# Patient Record
Sex: Female | Born: 1945
Health system: Southern US, Community
[De-identification: ages and names within clinical notes are randomized; demographics above are authoritative.]

## PROBLEM LIST (undated history)

## (undated) DIAGNOSIS — K449 Diaphragmatic hernia without obstruction or gangrene: Secondary | ICD-10-CM

## (undated) DIAGNOSIS — E785 Hyperlipidemia, unspecified: Secondary | ICD-10-CM

## (undated) DIAGNOSIS — B2790 Infectious mononucleosis, unspecified without complication: Secondary | ICD-10-CM

## (undated) DIAGNOSIS — C801 Malignant (primary) neoplasm, unspecified: Secondary | ICD-10-CM

## (undated) DIAGNOSIS — K5790 Diverticulosis of intestine, part unspecified, without perforation or abscess without bleeding: Secondary | ICD-10-CM

## (undated) DIAGNOSIS — I341 Nonrheumatic mitral (valve) prolapse: Secondary | ICD-10-CM

## (undated) DIAGNOSIS — M8580 Other specified disorders of bone density and structure, unspecified site: Secondary | ICD-10-CM

## (undated) DIAGNOSIS — N6019 Diffuse cystic mastopathy of unspecified breast: Secondary | ICD-10-CM

## (undated) DIAGNOSIS — Z8619 Personal history of other infectious and parasitic diseases: Secondary | ICD-10-CM

## (undated) DIAGNOSIS — M1990 Unspecified osteoarthritis, unspecified site: Secondary | ICD-10-CM

## (undated) DIAGNOSIS — I10 Essential (primary) hypertension: Secondary | ICD-10-CM

## (undated) DIAGNOSIS — K219 Gastro-esophageal reflux disease without esophagitis: Secondary | ICD-10-CM

## (undated) HISTORY — DX: Hyperlipidemia, unspecified: E78.5

## (undated) HISTORY — DX: Other specified disorders of bone density and structure, unspecified site: M85.80

## (undated) HISTORY — PX: REDUCTION MAMMAPLASTY: SUR839

## (undated) HISTORY — DX: Essential (primary) hypertension: I10

## (undated) HISTORY — DX: Unspecified osteoarthritis, unspecified site: M19.90

## (undated) HISTORY — DX: Personal history of other infectious and parasitic diseases: Z86.19

## (undated) HISTORY — DX: Gastro-esophageal reflux disease without esophagitis: K21.9

## (undated) HISTORY — DX: Nonrheumatic mitral (valve) prolapse: I34.1

## (undated) HISTORY — DX: Diaphragmatic hernia without obstruction or gangrene: K44.9

## (undated) HISTORY — DX: Diverticulosis of intestine, part unspecified, without perforation or abscess without bleeding: K57.90

## (undated) HISTORY — DX: Infectious mononucleosis, unspecified without complication: B27.90

---
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
## 2008-06-17 HISTORY — PX: BREAST SURGERY: SHX581

## 2013-06-17 HISTORY — PX: HIATAL HERNIA REPAIR: SHX195

## 2013-11-22 DIAGNOSIS — L820 Inflamed seborrheic keratosis: Secondary | ICD-10-CM | POA: Diagnosis not present

## 2013-11-22 DIAGNOSIS — L570 Actinic keratosis: Secondary | ICD-10-CM | POA: Diagnosis not present

## 2013-12-20 DIAGNOSIS — K219 Gastro-esophageal reflux disease without esophagitis: Secondary | ICD-10-CM | POA: Diagnosis not present

## 2013-12-20 DIAGNOSIS — K449 Diaphragmatic hernia without obstruction or gangrene: Secondary | ICD-10-CM | POA: Diagnosis not present

## 2013-12-24 DIAGNOSIS — K449 Diaphragmatic hernia without obstruction or gangrene: Secondary | ICD-10-CM | POA: Diagnosis not present

## 2014-01-12 DIAGNOSIS — I10 Essential (primary) hypertension: Secondary | ICD-10-CM | POA: Diagnosis not present

## 2014-01-12 DIAGNOSIS — K219 Gastro-esophageal reflux disease without esophagitis: Secondary | ICD-10-CM | POA: Diagnosis not present

## 2014-01-12 DIAGNOSIS — R002 Palpitations: Secondary | ICD-10-CM | POA: Diagnosis not present

## 2014-01-12 DIAGNOSIS — Z79899 Other long term (current) drug therapy: Secondary | ICD-10-CM | POA: Diagnosis not present

## 2014-01-12 DIAGNOSIS — E785 Hyperlipidemia, unspecified: Secondary | ICD-10-CM | POA: Diagnosis not present

## 2014-02-08 DIAGNOSIS — K219 Gastro-esophageal reflux disease without esophagitis: Secondary | ICD-10-CM | POA: Diagnosis not present

## 2014-02-21 DIAGNOSIS — J019 Acute sinusitis, unspecified: Secondary | ICD-10-CM | POA: Diagnosis not present

## 2014-02-22 DIAGNOSIS — K219 Gastro-esophageal reflux disease without esophagitis: Secondary | ICD-10-CM | POA: Diagnosis not present

## 2014-02-22 DIAGNOSIS — R1310 Dysphagia, unspecified: Secondary | ICD-10-CM | POA: Diagnosis not present

## 2014-02-28 DIAGNOSIS — I10 Essential (primary) hypertension: Secondary | ICD-10-CM | POA: Diagnosis not present

## 2014-02-28 DIAGNOSIS — Z0000 Encounter for general adult medical examination without abnormal findings: Secondary | ICD-10-CM | POA: Diagnosis not present

## 2014-02-28 DIAGNOSIS — M5382 Other specified dorsopathies, cervical region: Secondary | ICD-10-CM | POA: Diagnosis not present

## 2014-03-02 DIAGNOSIS — M5382 Other specified dorsopathies, cervical region: Secondary | ICD-10-CM | POA: Diagnosis not present

## 2014-03-02 DIAGNOSIS — I10 Essential (primary) hypertension: Secondary | ICD-10-CM | POA: Diagnosis not present

## 2014-03-16 DIAGNOSIS — K449 Diaphragmatic hernia without obstruction or gangrene: Secondary | ICD-10-CM | POA: Diagnosis not present

## 2014-03-16 DIAGNOSIS — K219 Gastro-esophageal reflux disease without esophagitis: Secondary | ICD-10-CM | POA: Diagnosis not present

## 2014-04-11 DIAGNOSIS — K219 Gastro-esophageal reflux disease without esophagitis: Secondary | ICD-10-CM | POA: Diagnosis not present

## 2014-04-12 DIAGNOSIS — J9811 Atelectasis: Secondary | ICD-10-CM | POA: Diagnosis not present

## 2014-04-12 DIAGNOSIS — R6881 Early satiety: Secondary | ICD-10-CM | POA: Diagnosis present

## 2014-04-12 DIAGNOSIS — J939 Pneumothorax, unspecified: Secondary | ICD-10-CM | POA: Diagnosis not present

## 2014-04-12 DIAGNOSIS — I340 Nonrheumatic mitral (valve) insufficiency: Secondary | ICD-10-CM | POA: Diagnosis present

## 2014-04-12 DIAGNOSIS — K219 Gastro-esophageal reflux disease without esophagitis: Secondary | ICD-10-CM | POA: Diagnosis not present

## 2014-04-12 DIAGNOSIS — K449 Diaphragmatic hernia without obstruction or gangrene: Secondary | ICD-10-CM | POA: Diagnosis not present

## 2014-04-13 DIAGNOSIS — J9811 Atelectasis: Secondary | ICD-10-CM | POA: Diagnosis not present

## 2014-04-13 DIAGNOSIS — J939 Pneumothorax, unspecified: Secondary | ICD-10-CM | POA: Diagnosis not present

## 2014-07-07 DIAGNOSIS — Z1231 Encounter for screening mammogram for malignant neoplasm of breast: Secondary | ICD-10-CM | POA: Diagnosis not present

## 2014-07-07 DIAGNOSIS — E782 Mixed hyperlipidemia: Secondary | ICD-10-CM | POA: Diagnosis not present

## 2014-07-13 DIAGNOSIS — Z1231 Encounter for screening mammogram for malignant neoplasm of breast: Secondary | ICD-10-CM | POA: Diagnosis not present

## 2014-08-26 DIAGNOSIS — R002 Palpitations: Secondary | ICD-10-CM | POA: Diagnosis not present

## 2014-08-26 DIAGNOSIS — E785 Hyperlipidemia, unspecified: Secondary | ICD-10-CM | POA: Diagnosis not present

## 2014-08-26 DIAGNOSIS — I10 Essential (primary) hypertension: Secondary | ICD-10-CM | POA: Diagnosis not present

## 2014-09-05 DIAGNOSIS — J019 Acute sinusitis, unspecified: Secondary | ICD-10-CM | POA: Diagnosis not present

## 2014-09-08 DIAGNOSIS — S61511A Laceration without foreign body of right wrist, initial encounter: Secondary | ICD-10-CM | POA: Diagnosis not present

## 2014-09-08 DIAGNOSIS — K219 Gastro-esophageal reflux disease without esophagitis: Secondary | ICD-10-CM | POA: Diagnosis not present

## 2014-09-08 DIAGNOSIS — S61411A Laceration without foreign body of right hand, initial encounter: Secondary | ICD-10-CM | POA: Diagnosis not present

## 2014-09-08 DIAGNOSIS — I10 Essential (primary) hypertension: Secondary | ICD-10-CM | POA: Diagnosis not present

## 2014-09-08 DIAGNOSIS — W5501XA Bitten by cat, initial encounter: Secondary | ICD-10-CM | POA: Diagnosis not present

## 2014-10-27 DIAGNOSIS — L910 Hypertrophic scar: Secondary | ICD-10-CM | POA: Diagnosis not present

## 2014-12-16 DIAGNOSIS — J0100 Acute maxillary sinusitis, unspecified: Secondary | ICD-10-CM | POA: Diagnosis not present

## 2014-12-16 DIAGNOSIS — R1031 Right lower quadrant pain: Secondary | ICD-10-CM | POA: Diagnosis not present

## 2015-01-19 DIAGNOSIS — J019 Acute sinusitis, unspecified: Secondary | ICD-10-CM | POA: Diagnosis not present

## 2015-02-28 DIAGNOSIS — I10 Essential (primary) hypertension: Secondary | ICD-10-CM | POA: Diagnosis not present

## 2015-03-15 ENCOUNTER — Ambulatory Visit (INDEPENDENT_AMBULATORY_CARE_PROVIDER_SITE_OTHER): Payer: Medicare Other | Admitting: Family

## 2015-03-15 ENCOUNTER — Encounter: Payer: Self-pay | Admitting: Family

## 2015-03-15 VITALS — BP 116/78 | HR 76 | Temp 98.1°F | Resp 16 | Ht 64.0 in | Wt 178.8 lb

## 2015-03-15 DIAGNOSIS — I340 Nonrheumatic mitral (valve) insufficiency: Secondary | ICD-10-CM

## 2015-03-15 DIAGNOSIS — Z23 Encounter for immunization: Secondary | ICD-10-CM

## 2015-03-15 DIAGNOSIS — J019 Acute sinusitis, unspecified: Secondary | ICD-10-CM

## 2015-03-15 DIAGNOSIS — E785 Hyperlipidemia, unspecified: Secondary | ICD-10-CM

## 2015-03-15 DIAGNOSIS — K219 Gastro-esophageal reflux disease without esophagitis: Secondary | ICD-10-CM

## 2015-03-15 DIAGNOSIS — I341 Nonrheumatic mitral (valve) prolapse: Secondary | ICD-10-CM | POA: Diagnosis not present

## 2015-03-15 DIAGNOSIS — I10 Essential (primary) hypertension: Secondary | ICD-10-CM

## 2015-03-15 LAB — BASIC METABOLIC PANEL
BUN: 16 mg/dL (ref 6–23)
CALCIUM: 9.9 mg/dL (ref 8.4–10.5)
CO2: 26 mEq/L (ref 19–32)
CREATININE: 0.84 mg/dL (ref 0.40–1.20)
Chloride: 102 mEq/L (ref 96–112)
GFR: 71.88 mL/min (ref >60.00)
GLUCOSE: 81 mg/dL (ref 70–99)
Potassium: 4 mEq/L (ref 3.5–5.1)
SODIUM: 138 meq/L (ref 135–145)

## 2015-03-15 MED ORDER — AMOXICILLIN-POT CLAVULANATE 875-125 MG PO TABS: 1.0000 | ORAL_TABLET | Freq: Two times a day (BID) | ORAL | Status: DC

## 2015-03-15 MED ORDER — ATORVASTATIN CALCIUM 10 MG PO TABS: 10.0000 mg | ORAL_TABLET | ORAL | Status: DC

## 2015-03-15 MED ORDER — VALSARTAN 160 MG PO TABS: 80.0000 mg | ORAL_TABLET | Freq: Every day | ORAL | Status: DC

## 2015-03-15 NOTE — Progress Notes (Signed)
Pre visit review using our clinic review tool, if applicable. No additional management support is needed unless otherwise documented below in the visit note. 

## 2015-03-15 NOTE — Patient Instructions (Addendum)
Please complete lab work prior to leaving.  Cut valsartan in 1/2 and take 1/2 tab once daily.  Start claritin 10mg  once daily. Start augmentin for sinusitis. Schedule a medicare wellness in 6 weeks.  Welcome to [facility]!

## 2015-03-15 NOTE — Progress Notes (Signed)
Subjective:    Patient ID: Debra Burgess, female    DOB: 1945-06-24, 69 y.o.   MRN: [id number]  HPI  Debra Burgess is a 69 yr old female who presents today to establish care.  She has chief complaint today of facial pressure/sinus drainage.  Reports every year around October she has similar symptoms.  She reports a low grade temp.  Nasal drainage is clear.  + maxillary pressure. Pressure has been present for about 8 days.  Pmhx is significant for the following:  1) HTN- current BP meds include lopressor and diovan. She reports some occasional dizziness.  She reports that her BP at home can be as low as 108/ 40-60 BP Readings from Last 3 Encounters:  03/15/15 116/78   2) Hyperlipidemia- she is on atorvastatin- can tolerate every other day.  If she takes every other day she does not have back pain pain.   3) GERD-  Resolved following a hiatal hernia repair.  4) mitral Valve regurg-  She would like referral to cardiology.  Reports that she was followed by cardiology. This was diagnosed after she developed palpitations/neck stiffness and was subsequently diagnosed.   Review of Systems  Constitutional: Negative for unexpected weight change.       Lost 20 pounds with improved diet since her heartburn is improved  HENT: Positive for rhinorrhea. Negative for hearing loss.   Eyes: Negative for visual disturbance.  Respiratory: Negative for cough.   Cardiovascular: Negative for chest pain and leg swelling.  Gastrointestinal: Negative for diarrhea and constipation.       Denies gerd  Genitourinary: Negative for dysuria and frequency.  Musculoskeletal: Positive for arthralgias.       Notes some hip stiffness with sitting, some arthritis in her hands  Skin: Negative for rash.  Neurological: Negative for headaches.  Hematological: Negative for adenopathy.  Psychiatric/Behavioral:       Denies depression/anxiety   Past Medical History  Diagnosis Date  . Arthritis   . History of chicken  pox   . Heart murmur   . Hypertension   . GERD (gastroesophageal reflux disease)     Social History   Social History  . Marital Status: Single    Spouse Name: N/A  . Number of Children: N/A  . Years of Education: N/A   Occupational History  . Not on file.   Social History Main Topics  . Smoking status: Never Smoker   . Smokeless tobacco: Never Used  . Alcohol Use: No  . Drug Use: No  . Sexual Activity: Not on file   Other Topics Concern  . Not on file   Social History Narrative    Past Surgical History  Procedure Laterality Date  . Tonsillectomy and adenoidectomy  1952  . Breast surgery Bilateral 2010    breast reduction  . Hernia repair  2015    "severe hiatal hernia repair"    Family History  Problem Relation Age of Onset  . Hypertension Mother   . Arthritis Mother   . Cancer Father   . Arthritis Sister   . Arthritis Maternal Grandmother   . Cancer Sister     ovarian and breast  . Diabetes Cousin     Allergies  Allergen Reactions  . Mango Flavor Anaphylaxis    No current outpatient prescriptions on file prior to visit.   No current facility-administered medications on file prior to visit.    BP 116/78 mmHg  Pulse 76  Temp(Src) 98.1 F (36.7 C) (Oral)  Resp 16  Ht 5\' 4"  (1.626 m)  Wt 178 lb 12.8 oz (81.103 kg)  BMI 30.68 kg/m2  SpO2 98%  LMP 10/15/1997       Objective:   Physical Exam  Constitutional: She is oriented to person, place, and time. She appears well-developed and well-nourished.  HENT:  Right Ear: Tympanic membrane and ear canal normal.  Left Ear: Tympanic membrane and ear canal normal.  Nose: Right sinus exhibits maxillary sinus tenderness and frontal sinus tenderness. Left sinus exhibits maxillary sinus tenderness and frontal sinus tenderness.  Mouth/Throat: No oropharyngeal exudate, posterior oropharyngeal edema or posterior oropharyngeal erythema.  Cardiovascular: Normal rate, regular rhythm and normal heart sounds.     No murmur heard. Pulmonary/Chest: Effort normal and breath sounds normal. No respiratory distress. She has no wheezes.  Musculoskeletal: She exhibits no edema.  Neurological: She is alert and oriented to person, place, and time.  Skin: Skin is warm and dry.  Psychiatric: She has a normal mood and affect. Her behavior is normal. Judgment and thought content normal.          Assessment & Plan:

## 2015-03-16 ENCOUNTER — Encounter: Payer: Self-pay | Admitting: Family

## 2015-03-16 DIAGNOSIS — I10 Essential (primary) hypertension: Secondary | ICD-10-CM | POA: Insufficient documentation

## 2015-03-16 DIAGNOSIS — K219 Gastro-esophageal reflux disease without esophagitis: Secondary | ICD-10-CM | POA: Insufficient documentation

## 2015-03-16 DIAGNOSIS — E785 Hyperlipidemia, unspecified: Secondary | ICD-10-CM | POA: Insufficient documentation

## 2015-03-16 DIAGNOSIS — I340 Nonrheumatic mitral (valve) insufficiency: Secondary | ICD-10-CM | POA: Insufficient documentation

## 2015-03-16 DIAGNOSIS — J329 Chronic sinusitis, unspecified: Secondary | ICD-10-CM | POA: Insufficient documentation

## 2015-03-16 NOTE — Assessment & Plan Note (Signed)
New. Rx with augmentin, add claritin.

## 2015-03-16 NOTE — Assessment & Plan Note (Signed)
Clinically compensated. Will refer to cardiology.

## 2015-03-16 NOTE — Assessment & Plan Note (Signed)
Tolerating statin, plan flp next visit.

## 2015-03-16 NOTE — Assessment & Plan Note (Signed)
Stable, monitor.  °

## 2015-03-16 NOTE — Assessment & Plan Note (Addendum)
Stable, continue current meds. Obtain bmet.  

## 2015-03-20 ENCOUNTER — Encounter: Payer: Self-pay | Admitting: Family

## 2015-03-24 ENCOUNTER — Telehealth: Payer: Self-pay | Admitting: *Deleted

## 2015-03-24 NOTE — Telephone Encounter (Signed)
Medical records received from [facility]. Forwarded to [employer]. JG//CMA

## 2015-03-27 ENCOUNTER — Encounter: Payer: Self-pay | Admitting: Family

## 2015-03-27 ENCOUNTER — Ambulatory Visit (INDEPENDENT_AMBULATORY_CARE_PROVIDER_SITE_OTHER): Payer: Medicare Other | Admitting: Family

## 2015-03-27 VITALS — BP 134/84 | HR 77 | Temp 98.1°F | Resp 16 | Ht 64.0 in | Wt 181.4 lb

## 2015-03-27 DIAGNOSIS — J208 Acute bronchitis due to other specified organisms: Secondary | ICD-10-CM | POA: Diagnosis not present

## 2015-03-27 NOTE — Progress Notes (Signed)
Pre visit review using our clinic review tool, if applicable. No additional management support is needed unless otherwise documented below in the visit note. 

## 2015-03-27 NOTE — Progress Notes (Signed)
Subjective:    Patient ID: Debra Burgess, female    DOB: 04/26/1946, 69 y.o.   MRN: [id number]  HPI  Debra Burgess is a 69 yr old female who presents today with chief complaint of cough.  Cough is described as productive and is associated with yellow sputum. Patient was treated for sinusitis on 9/29 with augmentin she reports she has one remaining dose to take. Completed abx. Sinus symptoms have resolved. Cough began 1 week ago. Began to cough on Thursday. Has had chills. Mucinex is thinning secretions.    Review of Systems See HPI  Past Medical History  Diagnosis Date  . Arthritis   . History of chicken pox   . Heart murmur   . Hypertension   . GERD (gastroesophageal reflux disease)   . Skin cancer   . Hyperlipidemia   . Mononucleosis     2/15    Social History   Social History  . Marital Status: Single    Spouse Name: N/A  . Number of Children: N/A  . Years of Education: N/A   Occupational History  . Not on file.   Social History Main Topics  . Smoking status: Never Smoker   . Smokeless tobacco: Never Used  . Alcohol Use: No  . Drug Use: No  . Sexual Activity: Not on file   Other Topics Concern  . Not on file   Social History Narrative   Recently moved to be near her daughter Debra Burgess.     Has 4 children   Teaches latin Youth worker) Technology, STEM (middle school) Writer at [employer]   Completed masters degree   Divorced     Past Surgical History  Procedure Laterality Date  . Tonsillectomy and adenoidectomy  1952  . Breast surgery Bilateral 2010    breast reduction  . Hernia repair  2015    "severe hiatal hernia repair"    Family History  Problem Relation Age of Onset  . Hypertension Mother   . Arthritis Mother   . Cancer Father   . Arthritis Sister   . Arthritis Maternal Grandmother   . Cancer Sister     ovarian and breast  . Diabetes Cousin     Allergies  Allergen Reactions  . Mango Flavor Anaphylaxis     Current Outpatient Prescriptions on File Prior to Visit  Medication Sig Dispense Refill  . amoxicillin-clavulanate (AUGMENTIN) 875-125 MG tablet Take 1 tablet by mouth 2 (two) times daily. 20 tablet 0  . aspirin 81 MG chewable tablet Chew 81 mg by mouth daily.    Marland Kitchen atorvastatin (LIPITOR) 10 MG tablet Take 1 tablet (10 mg total) by mouth every other day. 90 tablet 3  . metoprolol tartrate (LOPRESSOR) 25 MG tablet Take 25 mg by mouth at bedtime.    . Omega-3 Fatty Acids (FISH OIL) 1000 MG CAPS Take 1 capsule by mouth 2 (two) times daily.    . valsartan (DIOVAN) 160 MG tablet Take 0.5 tablets (80 mg total) by mouth daily.     No current facility-administered medications on file prior to visit.    BP 134/84 mmHg  Pulse 77  Temp(Src) 98.1 F (36.7 C) (Oral)  Resp 16  Ht 5\' 4"  (1.626 m)  Wt 181 lb 6.4 oz (82.283 kg)  BMI 31.12 kg/m2  SpO2 98%  LMP 10/15/1997       Objective:   Physical Exam  Constitutional: She appears well-developed and well-nourished.  HENT:  Right Ear: Tympanic membrane and  ear canal normal.  Left Ear: Tympanic membrane and ear canal normal.  Mouth/Throat: No oropharyngeal exudate, posterior oropharyngeal edema or posterior oropharyngeal erythema.  Cardiovascular: Normal rate, regular rhythm and normal heart sounds.   No murmur heard. Pulmonary/Chest: Effort normal and breath sounds normal. No respiratory distress. She has no wheezes.  Psychiatric: She has a normal mood and affect. Her behavior is normal. Judgment and thought content normal.          Assessment & Plan:  Viral bronchitis with cough- At this point, I think that her cough is viral in nature.  Advised her to continue Mucinex DM, complete last dose of augmentin for her sinusitis, call if symptoms worsen, if she develops fever, or if symptoms are not improved in 2-3 days.

## 2015-03-27 NOTE — Patient Instructions (Signed)
Please complete augmentin, Continue mucinex DM. Call if symptoms worsen, if you develop fever or if you are not feeling better in 2-3 days.

## 2015-04-04 NOTE — Progress Notes (Signed)
HPI: 69 year old female for evaluation of mitral valve prolapse. Outside records have been reviewed. Patient had a nuclear study in February 2012. Ejection fraction was 70%. There was apical thinning but no ischemia. Echocardiogram in 2012 showed normal LV function with no significant valvular heart disease. Carotid Dopplers 2012 showed minimal internal carotid artery disease bilaterally. Patient recently moved from [facility] to this area and presents to establish. She has dyspnea with more extreme activities but not routine activities. No orthopnea, PND, pedal edema, chest pain or syncope. She has had problems with palpitations in the past but this is well controlled with metoprolol.  Current Outpatient Prescriptions  Medication Sig Dispense Refill  . aspirin 81 MG chewable tablet Chew 81 mg by mouth daily.    Marland Kitchen atorvastatin (LIPITOR) 10 MG tablet Take 1 tablet (10 mg total) by mouth every other day. 90 tablet 3  . metoprolol tartrate (LOPRESSOR) 25 MG tablet Take 25 mg by mouth at bedtime.    . Omega-3 Fatty Acids (FISH OIL) 1000 MG CAPS Take 1 capsule by mouth 2 (two) times daily.    . valsartan (DIOVAN) 160 MG tablet Take 0.5 tablets (80 mg total) by mouth daily.     No current facility-administered medications for this visit.    Allergies  Allergen Reactions  . Mango Flavor Anaphylaxis     Past Medical History  Diagnosis Date  . Arthritis   . History of chicken pox   . MVP (mitral valve prolapse)   . Hypertension   . GERD (gastroesophageal reflux disease)   . Skin cancer   . Hyperlipidemia   . Mononucleosis     2/15    Past Surgical History  Procedure Laterality Date  . Tonsillectomy and adenoidectomy  1952  . Breast surgery Bilateral 2010    breast reduction  . Hernia repair  2015    "severe hiatal hernia repair"    Social History   Social History  . Marital Status: Single    Spouse Name: N/A  . Number of Children: 4  . Years of Education: N/A    Occupational History  . Not on file.   Social History Main Topics  . Smoking status: Never Smoker   . Smokeless tobacco: Never Used  . Alcohol Use: 0.0 oz/week    0 Standard drinks or equivalent per week     Comment: Rare  . Drug Use: No  . Sexual Activity: Not on file   Other Topics Concern  . Not on file   Social History Narrative   Recently moved to be near her daughter Mosetta Putt.     Has 4 children   Teaches latin Youth worker) Technology, STEM (middle school) Writer at [employer]   Completed masters degree   Divorced     Family History  Problem Relation Age of Onset  . Hypertension Mother   . Arthritis Mother   . Cancer Father   . Arthritis Sister   . Arthritis Maternal Grandmother   . Cancer Sister     ovarian and breast  . Diabetes Cousin     ROS:  Arthralgias but no fevers or chills, productive cough, hemoptysis, dysphasia, odynophagia, melena, hematochezia, dysuria, hematuria, rash, seizure activity, orthopnea, PND, pedal edema, claudication. Remaining systems are negative.  Physical Exam:   Blood pressure 140/60, pulse 111, height 5' 5.5" (1.664 m), weight 81.738 kg (180 lb 3.2 oz), last menstrual period 10/15/1997.  General:  Well developed/well nourished in NAD Skin warm/dry  Patient not depressed No peripheral clubbing Back-normal HEENT-normal/normal eyelids Neck supple/normal carotid upstroke bilaterally; no bruits; no JVD; no thyromegaly chest - CTA/ normal expansion CV - RRR/normal S1 and S2; no murmurs, rubs or gallops;  PMI nondisplaced Abdomen -NT/ND, no HSM, no mass, + bowel sounds, no bruit 2+ femoral pulses, no bruits Ext-no edema, chords, 2+ DP Neuro-grossly nonfocal  ECG Sinus tachycardia at a rate of 111. Nonspecific ST changes.

## 2015-04-10 ENCOUNTER — Ambulatory Visit (INDEPENDENT_AMBULATORY_CARE_PROVIDER_SITE_OTHER): Payer: Medicare Other | Admitting: Cardiology

## 2015-04-10 ENCOUNTER — Telehealth: Payer: Self-pay | Admitting: Family

## 2015-04-10 ENCOUNTER — Encounter: Payer: Self-pay | Admitting: Cardiology

## 2015-04-10 ENCOUNTER — Ambulatory Visit: Payer: Medicare Other | Admitting: Cardiology

## 2015-04-10 VITALS — BP 140/60 | HR 111 | Ht 65.5 in | Wt 180.2 lb

## 2015-04-10 DIAGNOSIS — I10 Essential (primary) hypertension: Secondary | ICD-10-CM | POA: Diagnosis not present

## 2015-04-10 DIAGNOSIS — I340 Nonrheumatic mitral (valve) insufficiency: Secondary | ICD-10-CM | POA: Diagnosis not present

## 2015-04-10 DIAGNOSIS — R002 Palpitations: Secondary | ICD-10-CM

## 2015-04-10 DIAGNOSIS — E2839 Other primary ovarian failure: Secondary | ICD-10-CM

## 2015-04-10 DIAGNOSIS — E785 Hyperlipidemia, unspecified: Secondary | ICD-10-CM

## 2015-04-10 MED ORDER — FISH OIL 1000 MG PO CAPS: 1.0000 | ORAL_CAPSULE | Freq: Two times a day (BID) | ORAL | Status: AC

## 2015-04-10 MED ORDER — VALSARTAN 160 MG PO TABS: 80.0000 mg | ORAL_TABLET | Freq: Every day | ORAL | Status: DC

## 2015-04-10 MED ORDER — ATORVASTATIN CALCIUM 10 MG PO TABS: 10.0000 mg | ORAL_TABLET | ORAL | Status: DC

## 2015-04-10 MED ORDER — METOPROLOL TARTRATE 25 MG PO TABS: 25.0000 mg | ORAL_TABLET | Freq: Every day | ORAL | Status: DC

## 2015-04-10 NOTE — Telephone Encounter (Signed)
No order in for bone density. Please enter and notify pt of where to schedule.   Appointment Request From: Daryll Brod      With Provider: Nance Pear., NP [facility] [facility]]      Preferred Date Range: From 04/11/2015 To 05/17/2015      Reason for visit: Office Visit      Comments:   [name] asked me to schedule a bone density test. I am available Monday-Friday after 2:45 pm.

## 2015-04-10 NOTE — Assessment & Plan Note (Signed)
Blood pressure controlled. Continue present medications. 

## 2015-04-10 NOTE — Assessment & Plan Note (Signed)
Patient with history of mitral valve prolapse by report. No mitral regurgitation on most recent echocardiogram.

## 2015-04-10 NOTE — Patient Instructions (Signed)
Medication Instructions:   NO CHANGE  Labwork:  Your physician recommends that you return for lab work PRIOR TO EATING IN THE [facility]ICE- 3RD FLOOR  Follow-Up:  Your physician wants you to follow-up in: Debra Burgess will receive a reminder letter in the mail two months in advance. If you don't receive a letter, please call our office to schedule the follow-up appointment.   If you need a refill on your cardiac medications before your next appointment, please call your pharmacy.

## 2015-04-10 NOTE — Assessment & Plan Note (Signed)
Symptoms well controlled with metoprolol. Will continue.

## 2015-04-10 NOTE — Assessment & Plan Note (Signed)
Continue statin. Check lipids and liver. 

## 2015-04-11 ENCOUNTER — Other Ambulatory Visit: Payer: Self-pay | Admitting: *Deleted

## 2015-04-11 DIAGNOSIS — E785 Hyperlipidemia, unspecified: Secondary | ICD-10-CM | POA: Diagnosis not present

## 2015-04-11 LAB — HEPATIC FUNCTION PANEL
ALBUMIN: 3.7 g/dL (ref 3.6–5.1)
ALT: 16 U/L (ref 6–29)
AST: 15 U/L (ref 10–35)
Alkaline Phosphatase: 90 U/L (ref 33–130)
BILIRUBIN TOTAL: 0.4 mg/dL (ref 0.2–1.2)
Bilirubin, Direct: 0.1 mg/dL (ref <=0.2)
Indirect Bilirubin: 0.3 mg/dL (ref 0.2–1.2)
Total Protein: 6.7 g/dL (ref 6.1–8.1)

## 2015-04-11 LAB — LIPID PANEL
CHOL/HDL RATIO: 3.1 ratio (ref <=5.0)
Cholesterol: 169 mg/dL (ref 125–200)
HDL: 55 mg/dL (ref >=46)
LDL Cholesterol: 84 mg/dL (ref <130)
Triglycerides: 152 mg/dL — ABNORMAL HIGH (ref <150)
VLDL: 30 mg/dL (ref <30)

## 2015-04-11 MED ORDER — VALSARTAN 160 MG PO TABS
80.0000 mg | ORAL_TABLET | Freq: Every day | ORAL | Status: DC
Start: 2015-04-11 — End: 2015-04-19

## 2015-04-19 ENCOUNTER — Other Ambulatory Visit: Payer: Self-pay | Admitting: Cardiology

## 2015-04-19 MED ORDER — VALSARTAN 160 MG PO TABS
80.0000 mg | ORAL_TABLET | Freq: Every day | ORAL | Status: DC
Start: 2015-04-19 — End: 2016-01-10

## 2015-04-21 ENCOUNTER — Encounter: Payer: Self-pay | Admitting: Family

## 2015-04-21 DIAGNOSIS — E2839 Other primary ovarian failure: Secondary | ICD-10-CM

## 2015-04-21 DIAGNOSIS — Z1159 Encounter for screening for other viral diseases: Secondary | ICD-10-CM

## 2015-04-21 NOTE — Telephone Encounter (Signed)
See mychart.  

## 2015-04-21 NOTE — Telephone Encounter (Signed)
Scheduled DEXA and lab appt for 04/24/15. Lab at 3:15 then DEXA at 3:30. Pt notified.

## 2015-04-24 ENCOUNTER — Other Ambulatory Visit: Payer: Medicare Other

## 2015-04-24 ENCOUNTER — Inpatient Hospital Stay (HOSPITAL_BASED_OUTPATIENT_CLINIC_OR_DEPARTMENT_OTHER): Admission: RE | Admit: 2015-04-24 | Payer: Medicare Other | Source: Ambulatory Visit

## 2015-04-28 ENCOUNTER — Other Ambulatory Visit (INDEPENDENT_AMBULATORY_CARE_PROVIDER_SITE_OTHER): Payer: Medicare Other

## 2015-04-28 ENCOUNTER — Ambulatory Visit (HOSPITAL_BASED_OUTPATIENT_CLINIC_OR_DEPARTMENT_OTHER)
Admission: RE | Admit: 2015-04-28 | Discharge: 2015-04-28 | Disposition: A | Discharge status: Home / Self Care | Payer: Medicare Other | Source: Ambulatory Visit | Attending: Family | Admitting: Family

## 2015-04-28 DIAGNOSIS — E2839 Other primary ovarian failure: Secondary | ICD-10-CM | POA: Diagnosis not present

## 2015-04-28 DIAGNOSIS — Z1159 Encounter for screening for other viral diseases: Secondary | ICD-10-CM | POA: Diagnosis not present

## 2015-04-28 DIAGNOSIS — Z13820 Encounter for screening for osteoporosis: Secondary | ICD-10-CM | POA: Insufficient documentation

## 2015-04-28 DIAGNOSIS — M8580 Other specified disorders of bone density and structure, unspecified site: Secondary | ICD-10-CM | POA: Insufficient documentation

## 2015-04-28 DIAGNOSIS — D72829 Elevated white blood cell count, unspecified: Secondary | ICD-10-CM | POA: Diagnosis not present

## 2015-04-28 DIAGNOSIS — M85852 Other specified disorders of bone density and structure, left thigh: Secondary | ICD-10-CM | POA: Diagnosis not present

## 2015-04-29 LAB — HEPATITIS C ANTIBODY: HCV Ab: NEGATIVE

## 2015-04-30 ENCOUNTER — Encounter: Payer: Self-pay | Admitting: Family

## 2015-04-30 DIAGNOSIS — M8580 Other specified disorders of bone density and structure, unspecified site: Secondary | ICD-10-CM

## 2015-04-30 HISTORY — DX: Other specified disorders of bone density and structure, unspecified site: M85.80

## 2015-06-19 DIAGNOSIS — R05 Cough: Secondary | ICD-10-CM | POA: Diagnosis not present

## 2015-06-19 DIAGNOSIS — J0100 Acute maxillary sinusitis, unspecified: Secondary | ICD-10-CM | POA: Diagnosis not present

## 2015-10-02 ENCOUNTER — Encounter: Payer: Self-pay | Admitting: Family

## 2015-10-02 ENCOUNTER — Ambulatory Visit (INDEPENDENT_AMBULATORY_CARE_PROVIDER_SITE_OTHER): Payer: Medicare Other | Admitting: Family

## 2015-10-02 VITALS — BP 124/87 | HR 87 | Temp 98.1°F | Resp 16 | Ht 64.0 in | Wt 186.2 lb

## 2015-10-02 DIAGNOSIS — R109 Unspecified abdominal pain: Secondary | ICD-10-CM | POA: Diagnosis not present

## 2015-10-02 NOTE — Patient Instructions (Signed)
Please complete lab work prior to leaving. We will schedule your CT scan for tomorrow. Please go to the ER if you develop severe/worsening abdominal pain or if you are unable to keep down food/liquids.  Continue heating pad/tylenol for your back. Let us know if your back pain does not continue to improve.

## 2015-10-02 NOTE — Progress Notes (Signed)
Pre visit review using our clinic review tool, if applicable. No additional management support is needed unless otherwise documented below in the visit note. 

## 2015-10-02 NOTE — Progress Notes (Signed)
Subjective:    Patient ID: Debra Burgess, female    DOB: Nov 03, 1945, 70 y.o.   MRN: [id number]  HPI  Debra Burgess is a 70 yr old female who presents today with multiple GI complaints. She reports intermittent nausea x 4 days.  Has associated gas/burping, bloating and diarrhea x 1 week.  She is concerned that she may have ovarian cancer.  + GERD which is unusual for her since her hiatal hernia repair.  Diarrhea is intermittent, is having some formed stools as well.  Reports slight subjective temps but has not checked her temp.  Denies dysuria.    Low back pain- located on the right side, radiates down the right leg.  Skin Rash- has been present since the weekend. Notes her cat brushed up against her.     Review of Systems See HPI  Past Medical History  Diagnosis Date  . Arthritis   . History of chicken pox   . MVP (mitral valve prolapse)   . Hypertension   . GERD (gastroesophageal reflux disease)   . Skin cancer   . Hyperlipidemia   . Mononucleosis     2/15  . Osteopenia 04/30/2015     Social History   Social History  . Marital Status: Single    Spouse Name: N/A  . Number of Children: 4  . Years of Education: N/A   Occupational History  . Not on file.   Social History Main Topics  . Smoking status: Never Smoker   . Smokeless tobacco: Never Used  . Alcohol Use: 0.0 oz/week    0 Standard drinks or equivalent per week     Comment: Rare  . Drug Use: No  . Sexual Activity: Not on file   Other Topics Concern  . Not on file   Social History Narrative   Recently moved to be near her daughter Debra Burgess.     Has 4 children   Teaches latin Youth worker) Technology, STEM (middle school) Writer at [employer]   Completed masters degree   Divorced     Past Surgical History  Procedure Laterality Date  . Tonsillectomy and adenoidectomy  1952  . Breast surgery Bilateral 2010    breast reduction  . Hernia repair  2015    "severe hiatal  hernia repair"    Family History  Problem Relation Age of Onset  . Hypertension Mother   . Arthritis Mother   . Cancer Father   . Arthritis Sister   . Arthritis Maternal Grandmother   . Cancer Sister     ovarian and breast  . Diabetes Cousin     Allergies  Allergen Reactions  . Mango Flavor Anaphylaxis    Current Outpatient Prescriptions on File Prior to Visit  Medication Sig Dispense Refill  . aspirin 81 MG chewable tablet Chew 81 mg by mouth daily.    Marland Kitchen atorvastatin (LIPITOR) 10 MG tablet Take 1 tablet (10 mg total) by mouth every other day. 90 tablet 3  . metoprolol tartrate (LOPRESSOR) 25 MG tablet Take 1 tablet (25 mg total) by mouth at bedtime. 90 tablet 3  . Omega-3 Fatty Acids (FISH OIL) 1000 MG CAPS Take 1 capsule (1,000 mg total) by mouth 2 (two) times daily. 180 capsule 3  . valsartan (DIOVAN) 160 MG tablet Take 0.5 tablets (80 mg total) by mouth daily. 45 tablet 3   No current facility-administered medications on file prior to visit.    BP 124/87 mmHg  Pulse 87  Temp(Src)  98.1 F (36.7 C) (Oral)  Resp 16  Ht 5\' 4"  (1.626 m)  Wt 186 lb 3.2 oz (84.46 kg)  BMI 31.95 kg/m2  SpO2 98%  LMP 10/15/1997       Objective:   Physical Exam  Constitutional: She is oriented to person, place, and time. She appears well-developed and well-nourished.  HENT:  Head: Normocephalic and atraumatic.  Cardiovascular: Normal rate, regular rhythm and normal heart sounds.   No murmur heard. Pulmonary/Chest: Effort normal and breath sounds normal. No respiratory distress. She has no wheezes.  Abdominal: Soft. Bowel sounds are normal. She exhibits no distension. There is no tenderness. There is no rebound.  Musculoskeletal: She exhibits no edema.  Lymphadenopathy:    She has no cervical adenopathy.  Neurological: She is alert and oriented to person, place, and time.  Skin:  Mild irritation noted beneath chin. No swelling.   Psychiatric: She has a normal mood and affect. Her  behavior is normal. Judgment and thought content normal.          Assessment & Plan:  Abdominal discomfort- I suspect a resolving viral gastroenteritis. However, given her strong family history of ovarian CA will order CT abd/pelvis to be performed tomorrow. She is instructed to go to the ER if she develop worsening abdominal pain overnight.   Low back pain- mild sciatic symptoms.  Improving with tylenol, heat, continue same.

## 2015-10-03 ENCOUNTER — Ambulatory Visit (HOSPITAL_BASED_OUTPATIENT_CLINIC_OR_DEPARTMENT_OTHER)
Admission: RE | Admit: 2015-10-03 | Discharge: 2015-10-03 | Disposition: A | Discharge status: Home / Self Care | Payer: Commercial Managed Care - PPO | Source: Ambulatory Visit | Attending: Family | Admitting: Family

## 2015-10-03 DIAGNOSIS — K449 Diaphragmatic hernia without obstruction or gangrene: Secondary | ICD-10-CM | POA: Diagnosis not present

## 2015-10-03 DIAGNOSIS — K5730 Diverticulosis of large intestine without perforation or abscess without bleeding: Secondary | ICD-10-CM | POA: Diagnosis not present

## 2015-10-03 DIAGNOSIS — R109 Unspecified abdominal pain: Secondary | ICD-10-CM | POA: Diagnosis not present

## 2015-10-03 LAB — CBC WITH DIFFERENTIAL/PLATELET
Basophils Absolute: 0.1 10*3/uL (ref 0.0–0.1)
Basophils Relative: 0.8 % (ref 0.0–3.0)
EOS ABS: 0.2 10*3/uL (ref 0.0–0.7)
EOS PCT: 1.5 % (ref 0.0–5.0)
HCT: 42 % (ref 36.0–46.0)
Hemoglobin: 14 g/dL (ref 12.0–15.0)
LYMPHS ABS: 4.1 10*3/uL — AB (ref 0.7–4.0)
Lymphocytes Relative: 31 % (ref 12.0–46.0)
MCHC: 33.3 g/dL (ref 30.0–36.0)
MCV: 88 fl (ref 78.0–100.0)
MONO ABS: 1 10*3/uL (ref 0.1–1.0)
Monocytes Relative: 7.9 % (ref 3.0–12.0)
NEUTROS PCT: 58.8 % (ref 43.0–77.0)
Neutro Abs: 7.7 10*3/uL (ref 1.4–7.7)
Platelets: 328 10*3/uL (ref 150.0–400.0)
RBC: 4.77 Mil/uL (ref 3.87–5.11)
RDW: 13.9 % (ref 11.5–15.5)
WBC: 13.1 10*3/uL — ABNORMAL HIGH (ref 4.0–10.5)

## 2015-10-03 LAB — COMPREHENSIVE METABOLIC PANEL
ALBUMIN: 4.1 g/dL (ref 3.5–5.2)
ALK PHOS: 85 U/L (ref 39–117)
ALT: 16 U/L (ref 0–35)
AST: 16 U/L (ref 0–37)
BILIRUBIN TOTAL: 0.3 mg/dL (ref 0.2–1.2)
BUN: 17 mg/dL (ref 6–23)
CO2: 28 mEq/L (ref 19–32)
CREATININE: 0.85 mg/dL (ref 0.40–1.20)
Calcium: 9.9 mg/dL (ref 8.4–10.5)
Chloride: 106 mEq/L (ref 96–112)
GFR: 70.79 mL/min (ref >60.00)
GLUCOSE: 89 mg/dL (ref 70–99)
Potassium: 4.4 mEq/L (ref 3.5–5.1)
SODIUM: 140 meq/L (ref 135–145)
TOTAL PROTEIN: 7.5 g/dL (ref 6.0–8.3)

## 2015-10-03 MED ORDER — IOPAMIDOL (ISOVUE-300) INJECTION 61%
100.0000 mL | Freq: Once | INTRAVENOUS | Status: AC | PRN
  Administered 2015-10-03: 100 mL via INTRAVENOUS

## 2015-10-04 ENCOUNTER — Encounter: Payer: Self-pay | Admitting: Family

## 2015-10-07 ENCOUNTER — Ambulatory Visit (HOSPITAL_BASED_OUTPATIENT_CLINIC_OR_DEPARTMENT_OTHER): Payer: Medicare Other

## 2015-10-09 NOTE — Addendum Note (Signed)
Addended by: Caffie Pinto on: 10/09/2015 10:51 AM   Modules accepted: Orders

## 2015-10-17 ENCOUNTER — Telehealth: Payer: Self-pay | Admitting: *Deleted

## 2015-10-17 DIAGNOSIS — R195 Other fecal abnormalities: Secondary | ICD-10-CM

## 2015-10-17 NOTE — Telephone Encounter (Signed)
Cologuard ordered and letter mailed to patient.

## 2015-10-21 DIAGNOSIS — Z1211 Encounter for screening for malignant neoplasm of colon: Secondary | ICD-10-CM | POA: Diagnosis not present

## 2015-10-21 DIAGNOSIS — Z1212 Encounter for screening for malignant neoplasm of rectum: Secondary | ICD-10-CM | POA: Diagnosis not present

## 2015-11-02 LAB — COLOGUARD: Cologuard: POSITIVE

## 2015-11-03 NOTE — Telephone Encounter (Signed)
Please let pt know that stool screening test is abnormal. I would like her to meet with GI for further evaluation.

## 2015-11-03 NOTE — Telephone Encounter (Signed)
Debra Burgess called in to confirm receipt of the cologuard ordered. She says that if it has not been received call [phone]

## 2015-11-03 NOTE — Telephone Encounter (Signed)
Received positive cologuard result and health maintenance has been updated / abstracted. Please advise.

## 2015-11-03 NOTE — Addendum Note (Signed)
Addended by: [name] on: 11/03/2015 03:50 PM   Modules accepted: Orders

## 2015-11-06 NOTE — Telephone Encounter (Signed)
Notified pt and she is agreeable to proceed with GI referral. Referral order signed.

## 2015-11-06 NOTE — Addendum Note (Signed)
Addended by: Kelle Darting A on: 11/06/2015 10:13 AM   Modules accepted: Orders

## 2015-11-07 ENCOUNTER — Telehealth: Payer: Self-pay | Admitting: Internal Medicine

## 2015-11-07 NOTE — Telephone Encounter (Signed)
Left message for pt to call back  °

## 2015-11-08 NOTE — Telephone Encounter (Signed)
Pt returned call

## 2015-11-09 ENCOUNTER — Telehealth: Payer: Self-pay | Admitting: Family

## 2015-11-09 NOTE — Telephone Encounter (Signed)
Left message for pt to call back  °

## 2015-11-09 NOTE — Telephone Encounter (Signed)
Called patient regarding lab results. Left message for call back.

## 2015-11-09 NOTE — Telephone Encounter (Signed)
Please contact pt and let her know that I reviewed her colon cancer screening test (cologuard) and it is abnormal. She should be contacted about her referral to GI. If she has not been contacted in 1 week, let me know.

## 2015-11-11 ENCOUNTER — Encounter: Payer: Self-pay | Admitting: Family

## 2015-11-14 NOTE — Telephone Encounter (Signed)
Pt has diarrhea and nausea, 12 days ago had positive cologuard.  Moved to the area last year.  No GI history in [place].  Pt has been scheduled to see Janett Billow for 11/16/15

## 2015-11-14 NOTE — Telephone Encounter (Signed)
See patient e-mail.

## 2015-11-15 ENCOUNTER — Emergency Department (HOSPITAL_BASED_OUTPATIENT_CLINIC_OR_DEPARTMENT_OTHER)
Admission: EM | Admit: 2015-11-15 | Discharge: 2015-11-15 | Disposition: A | Discharge status: Home / Self Care | Payer: Commercial Managed Care - PPO | Attending: Emergency Medicine | Admitting: Emergency Medicine

## 2015-11-15 ENCOUNTER — Emergency Department (HOSPITAL_BASED_OUTPATIENT_CLINIC_OR_DEPARTMENT_OTHER): Admission: EM | Admit: 2015-11-15 | Payer: Medicare Other | Source: Home / Self Care

## 2015-11-15 ENCOUNTER — Encounter (HOSPITAL_BASED_OUTPATIENT_CLINIC_OR_DEPARTMENT_OTHER): Payer: Self-pay

## 2015-11-15 DIAGNOSIS — E785 Hyperlipidemia, unspecified: Secondary | ICD-10-CM | POA: Insufficient documentation

## 2015-11-15 DIAGNOSIS — I10 Essential (primary) hypertension: Secondary | ICD-10-CM | POA: Diagnosis not present

## 2015-11-15 DIAGNOSIS — Z85828 Personal history of other malignant neoplasm of skin: Secondary | ICD-10-CM | POA: Insufficient documentation

## 2015-11-15 DIAGNOSIS — R109 Unspecified abdominal pain: Secondary | ICD-10-CM

## 2015-11-15 DIAGNOSIS — N390 Urinary tract infection, site not specified: Secondary | ICD-10-CM | POA: Diagnosis not present

## 2015-11-15 DIAGNOSIS — R42 Dizziness and giddiness: Secondary | ICD-10-CM | POA: Diagnosis not present

## 2015-11-15 DIAGNOSIS — M1990 Unspecified osteoarthritis, unspecified site: Secondary | ICD-10-CM | POA: Insufficient documentation

## 2015-11-15 LAB — URINALYSIS, ROUTINE W REFLEX MICROSCOPIC
Bilirubin Urine: NEGATIVE
Glucose, UA: NEGATIVE mg/dL
Ketones, ur: NEGATIVE mg/dL
Nitrite: NEGATIVE
Protein, ur: NEGATIVE mg/dL
Specific Gravity, Urine: 1.007 (ref 1.005–1.030)
pH: 5.5 (ref 5.0–8.0)

## 2015-11-15 LAB — COMPREHENSIVE METABOLIC PANEL
ALT: 21 U/L (ref 14–54)
AST: 23 U/L (ref 15–41)
Albumin: 3.7 g/dL (ref 3.5–5.0)
Alkaline Phosphatase: 91 U/L (ref 38–126)
Anion gap: 8 (ref 5–15)
BUN: 14 mg/dL (ref 6–20)
CO2: 24 mmol/L (ref 22–32)
Calcium: 9.3 mg/dL (ref 8.9–10.3)
Chloride: 107 mmol/L (ref 101–111)
Creatinine, Ser: 0.71 mg/dL (ref 0.44–1.00)
GFR calc Af Amer: >60 mL/min (ref >60)
GFR calc non Af Amer: >60 mL/min (ref >60)
Glucose, Bld: 101 mg/dL — ABNORMAL HIGH (ref 65–99)
Potassium: 3.8 mmol/L (ref 3.5–5.1)
Sodium: 139 mmol/L (ref 135–145)
Total Bilirubin: 0.3 mg/dL (ref 0.3–1.2)
Total Protein: 7 g/dL (ref 6.5–8.1)

## 2015-11-15 LAB — CBC
HCT: 39.9 % (ref 36.0–46.0)
Hemoglobin: 13.1 g/dL (ref 12.0–15.0)
MCH: 29.6 pg (ref 26.0–34.0)
MCHC: 32.8 g/dL (ref 30.0–36.0)
MCV: 90.1 fL (ref 78.0–100.0)
Platelets: 304 10*3/uL (ref 150–400)
RBC: 4.43 MIL/uL (ref 3.87–5.11)
RDW: 13.3 % (ref 11.5–15.5)
WBC: 11.5 10*3/uL — ABNORMAL HIGH (ref 4.0–10.5)

## 2015-11-15 LAB — LIPASE, BLOOD: Lipase: 22 U/L (ref 11–51)

## 2015-11-15 LAB — URINE MICROSCOPIC-ADD ON

## 2015-11-15 MED ORDER — CEPHALEXIN 500 MG PO CAPS: 500.0000 mg | ORAL_CAPSULE | Freq: Three times a day (TID) | ORAL | Status: DC

## 2015-11-15 MED ORDER — DEXTROSE 5 % IV SOLN
1.0000 g | Freq: Once | INTRAVENOUS | Status: AC
  Administered 2015-11-15: 1 g via INTRAVENOUS
  Filled 2015-11-15: qty 10

## 2015-11-15 MED FILL — CEPHALEXIN 500 MG CAPSULE: 500 | 5 days supply | Qty: 15 | Fill #0

## 2015-11-15 NOTE — ED Notes (Signed)
Per registration, pt checked in to ED earlier and left due to need to get phone

## 2015-11-15 NOTE — ED Notes (Addendum)
C/o left side abd pain, n/d-was seen by PCP with CT and labs 3 weeks ago-has appt with GI tomorrow-pt c/o increase in pain x today-became dizzy when she stood up from bed this am at 4am-NAD-steady gait

## 2015-11-15 NOTE — ED Notes (Signed)
Pt describes diarrhea, gassy feeling, pain in spleen, full feeling with eating. Saw Dr. Conley Canal and CT was done. "Normal". Appt with GI tomorrow. Dizziness onset 4a and BP was up. Took meds to help lower BP PTA.

## 2015-11-15 NOTE — Discharge Instructions (Signed)
Vertigo Vertigo means you feel like you or your surroundings are moving when they are not. Vertigo can be dangerous if it occurs when you are at work, driving, or performing difficult activities.  CAUSES  Vertigo occurs when there is a conflict of signals sent to your brain from the visual and sensory systems in your body. There are many different causes of vertigo, including:  Infections, especially in the inner ear.  A bad reaction to a drug or misuse of alcohol and medicines.  Withdrawal from drugs or alcohol.  Rapidly changing positions, such as lying down or rolling over in bed.  A migraine headache.  Decreased blood flow to the brain.  Increased pressure in the brain from a head injury, infection, tumor, or bleeding. SYMPTOMS  You may feel as though the world is spinning around or you are falling to the ground. Because your balance is upset, vertigo can cause nausea and vomiting. You may have involuntary eye movements (nystagmus). DIAGNOSIS  Vertigo is usually diagnosed by physical exam. If the cause of your vertigo is unknown, your caregiver may perform imaging tests, such as an MRI scan (magnetic resonance imaging). TREATMENT  Most cases of vertigo resolve on their own, without treatment. Depending on the cause, your caregiver may prescribe certain medicines. If your vertigo is related to body position issues, your caregiver may recommend movements or procedures to correct the problem. In rare cases, if your vertigo is caused by certain inner ear problems, you may need surgery. HOME CARE INSTRUCTIONS   Follow your caregiver's instructions.  Avoid driving.  Avoid operating heavy machinery.  Avoid performing any tasks that would be dangerous to you or others during a vertigo episode.  Tell your caregiver if you notice that certain medicines seem to be causing your vertigo. Some of the medicines used to treat vertigo episodes can actually make them worse in some people. SEEK  IMMEDIATE MEDICAL CARE IF:   Your medicines do not relieve your vertigo or are making it worse.  You develop problems with talking, walking, weakness, or using your arms, hands, or legs.  You develop severe headaches.  Your nausea or vomiting continues or gets worse.  You develop visual changes.  A family member notices behavioral changes.  Your condition gets worse. MAKE SURE YOU:  Understand these instructions.  Will watch your condition.  Will get help right away if you are not doing well or get worse.   This information is not intended to replace advice given to you by your health care provider. Make sure you discuss any questions you have with your health care provider.   Document Released: 03/13/2005 Document Revised: 08/26/2011 Document Reviewed: 09/26/2014 Elsevier Interactive Patient Education 2016 Elsevier Inc[employer]  Abdominal Pain, Adult Many things can cause abdominal pain. Usually, abdominal pain is not caused by a disease and will improve without treatment. It can often be observed and treated at home. Your health care provider will do a physical exam and possibly order blood tests and X-rays to help determine the seriousness of your pain. However, in many cases, more time must pass before a clear cause of the pain can be found. Before that point, your health care provider may not know if you need more testing or further treatment. HOME CARE INSTRUCTIONS Monitor your abdominal pain for any changes. The following actions may help to alleviate any discomfort you are experiencing:  Only take over-the-counter or prescription medicines as directed by your health care provider.  Do not take laxatives  unless directed to do so by your health care provider.  Try a clear liquid diet (broth, tea, or water) as directed by your health care provider. Slowly move to a bland diet as tolerated. SEEK MEDICAL CARE IF:  You have unexplained abdominal pain.  You have abdominal pain  associated with nausea or diarrhea.  You have pain when you urinate or have a bowel movement.  You experience abdominal pain that wakes you in the night.  You have abdominal pain that is worsened or improved by eating food.  You have abdominal pain that is worsened with eating fatty foods.  You have a fever. SEEK IMMEDIATE MEDICAL CARE IF:  Your pain does not go away within 2 hours.  You keep throwing up (vomiting).  Your pain is felt only in portions of the abdomen, such as the right side or the left lower portion of the abdomen.  You pass bloody or black tarry stools. MAKE SURE YOU:  Understand these instructions.  Will watch your condition.  Will get help right away if you are not doing well or get worse.   This information is not intended to replace advice given to you by your health care provider. Make sure you discuss any questions you have with your health care provider.   Document Released: 03/13/2005 Document Revised: 02/22/2015 Document Reviewed: 02/10/2013 Elsevier Interactive Patient Education [employer].

## 2015-11-16 ENCOUNTER — Encounter: Payer: Self-pay | Admitting: Gastroenterology

## 2015-11-16 ENCOUNTER — Ambulatory Visit (INDEPENDENT_AMBULATORY_CARE_PROVIDER_SITE_OTHER): Payer: Commercial Managed Care - PPO | Admitting: Gastroenterology

## 2015-11-16 VITALS — BP 132/74 | HR 68 | Ht 65.5 in | Wt 188.1 lb

## 2015-11-16 DIAGNOSIS — R194 Change in bowel habit: Secondary | ICD-10-CM

## 2015-11-16 DIAGNOSIS — R195 Other fecal abnormalities: Secondary | ICD-10-CM | POA: Diagnosis not present

## 2015-11-16 DIAGNOSIS — R140 Abdominal distension (gaseous): Secondary | ICD-10-CM | POA: Diagnosis not present

## 2015-11-16 NOTE — Patient Instructions (Signed)
Take IBGard, 1 capsule twice daily. We have given you a coupon.   You have been scheduled for a colonoscopy. Please follow written instructions given to you at your visit today.  Please pick up your prep supplies at the pharmacy within the next 1-3 days. If you use inhalers (even only as needed), please bring them with you on the day of your procedure. Your physician has requested that you go to www.startemmi.com and enter the access code given to you at your visit today. This web site gives a general overview about your procedure. However, you should still follow specific instructions given to you by our office regarding your preparation for the procedure.

## 2015-11-17 ENCOUNTER — Encounter: Payer: Self-pay | Admitting: Gastroenterology

## 2015-11-17 LAB — URINE CULTURE: Culture: >=100000 — AB

## 2015-11-18 ENCOUNTER — Telehealth (HOSPITAL_BASED_OUTPATIENT_CLINIC_OR_DEPARTMENT_OTHER): Payer: Self-pay

## 2015-11-18 NOTE — Telephone Encounter (Signed)
Post ED Visit - Positive Culture Follow-up  Culture report reviewed by antimicrobial stewardship pharmacist:  []  Elenor Quinones, Pharm.D. []  Heide Guile, Pharm.D., BCPS []  Parks Neptune, Pharm.D. []  Alycia Rossetti, Pharm.D., BCPS []  [place], Pharm.D., BCPS, AAHIVP [x]  Legrand Como, Pharm.D., BCPS, AAHIVP []  Milus Glazier, Pharm.D. []  Stephens November, [place].D.  Positive urine culture Treated with Cephalexin, organism sensitive to the same and no further patient follow-up is required at this time.  Genia Del 11/18/2015, 9:53 AM

## 2015-11-27 NOTE — ED Provider Notes (Signed)
CSN: [id number]     Arrival date & time 11/15/15  1215 History   First MD Initiated Contact with Patient 11/15/15 1422     Chief Complaint  Patient presents with  . Abdominal Pain     (Consider location/radiation/quality/duration/timing/severity/associated sxs/prior Treatment) HPI   70 year old female with multiple complaints, but primarily abdominal pain and vertigo. She has been having intermittent abdominal pain for weeks to months. Seen by PCP for the same also had a CT which is unremarkable. She has a follow-up appointment with GI tomorrow. For the past and half she has had increasing pain particularly suprapubically in the left lower quadrant. Waxes and wanes. No appreciable exacerbating relieving factors. No change in bowel movements. No urinary complaints. Nauseated, but no vomiting. She is also complaining of vertigo. Symptomatic with changes of position such as laying to standing or sitting to standing. Symptoms resolve when still. No other acute neurological complaints. No tinnitus.   Past Medical History  Diagnosis Date  . Arthritis   . History of chicken pox   . MVP (mitral valve prolapse)   . Hypertension   . GERD (gastroesophageal reflux disease)   . Basal cell carcinoma   . Hyperlipidemia   . Mononucleosis     2/15  . Osteopenia 04/30/2015  . Hiatal hernia   . Diverticulosis    Past Surgical History  Procedure Laterality Date  . Tonsillectomy and adenoidectomy  1952  . Breast surgery Bilateral 2010    breast reduction  . Hiatal hernia repair  2015    "severe hiatal hernia repair", robotic repair with pig skin sheath   Family History  Problem Relation Age of Onset  . Hypertension Mother   . Arthritis Mother   . Prostate cancer Father   . Arthritis Sister   . Arthritis Maternal Grandmother   . Ovarian cancer Sister     ovarian and breast  . Diabetes Cousin     maternal  . Breast cancer Sister   . Colon polyps Mother     had 2 surgeries   Social  History  Substance Use Topics  . Smoking status: Never Smoker   . Smokeless tobacco: Never Used  . Alcohol Use: 0.0 oz/week    0 Standard drinks or equivalent per week     Comment: Rarely   OB History    No data available     Review of Systems  All systems reviewed and negative, other than as noted in HPI.   Allergies  Mango flavor  Home Medications   Prior to Admission medications   Medication Sig Start Date End Date Taking? Authorizing Provider  aspirin 81 MG chewable tablet Chew 81 mg by mouth. 3 times a week    Historical Provider, MD  atorvastatin (LIPITOR) 10 MG tablet Take 1 tablet (10 mg total) by mouth every other day. 04/10/15   Lelon Perla, MD  cephALEXin (KEFLEX) 500 MG capsule Take 1 capsule (500 mg total) by mouth 3 (three) times daily. 11/15/15   Virgel Manifold, MD  metoprolol tartrate (LOPRESSOR) 25 MG tablet Take 1 tablet (25 mg total) by mouth at bedtime. 04/10/15   Lelon Perla, MD  Omega-3 Fatty Acids (FISH OIL) 1000 MG CAPS Take 1 capsule (1,000 mg total) by mouth 2 (two) times daily. 04/10/15   Lelon Perla, MD  valsartan (DIOVAN) 160 MG tablet Take 0.5 tablets (80 mg total) by mouth daily. 04/19/15   Lelon Perla, MD   BP 137/71 mmHg  Pulse  62  Temp(Src) 97.7 F (36.5 C) (Oral)  Resp 20  Ht 5\' 5"  (1.651 m)  Wt 188 lb (85.276 kg)  BMI 31.28 kg/m2  SpO2 95%  LMP 10/15/1997 Physical Exam  Constitutional: She is oriented to person, place, and time. She appears well-developed and well-nourished. No distress.  HENT:  Head: Normocephalic and atraumatic.  Eyes: Conjunctivae are normal. Right eye exhibits no discharge. Left eye exhibits no discharge.  Neck: Neck supple.  Cardiovascular: Normal rate, regular rhythm and normal heart sounds.  Exam reveals no gallop and no friction rub.   No murmur heard. Pulmonary/Chest: Effort normal and breath sounds normal. No respiratory distress.  Abdominal: Soft. She exhibits no distension. There is  tenderness. There is no rebound and no guarding.  TTP periumbilically LU and LLQ.   Genitourinary:  No CVA tenderness  Musculoskeletal: She exhibits no edema or tenderness.  Neurological: She is alert and oriented to person, place, and time. No cranial nerve deficit. She exhibits normal muscle tone. Coordination normal.  Skin: Skin is warm and dry.  Psychiatric: She has a normal mood and affect. Her behavior is normal. Thought content normal.  Nursing note and vitals reviewed.   ED Course  Procedures (including critical care time) Labs Review Labs Reviewed  URINE CULTURE - Abnormal; Notable for the following:    Culture >=100,000 COLONIES/mL ESCHERICHIA COLI (*)    Organism ID, Bacteria ESCHERICHIA COLI (*)    All other components within normal limits  COMPREHENSIVE METABOLIC PANEL - Abnormal; Notable for the following:    Glucose, Bld 101 (*)    All other components within normal limits  CBC - Abnormal; Notable for the following:    WBC 11.5 (*)    All other components within normal limits  URINALYSIS, ROUTINE W REFLEX MICROSCOPIC (NOT AT [facility]) - Abnormal; Notable for the following:    Hgb urine dipstick TRACE (*)    Leukocytes, UA SMALL (*)    All other components within normal limits  URINE MICROSCOPIC-ADD ON - Abnormal; Notable for the following:    Squamous Epithelial / LPF 0-5 (*)    Bacteria, UA MANY (*)    All other components within normal limits  LIPASE, BLOOD    Imaging Review No results found. I have personally reviewed and evaluated these images and lab results as part of my medical decision-making.   EKG Interpretation None      MDM   Final diagnoses:  Abdominal pain, unspecified abdominal location  UTI (lower urinary tract infection)  Vertigo    6 female multiple complaints. Suspect that her abdominal pain or at least the more acute component is secondary to UTI. She was placed on antibiotics. She also describes vertiginous symptoms. Suspect  peripheral etiology. It has been determined that no acute conditions requiring further emergency intervention are present at this time. The patient has been advised of the diagnosis and plan. I reviewed any labs and imaging including any potential incidental findings. We have discussed signs and symptoms that warrant return to the ED and they are listed in the discharge instructions.      Virgel Manifold, MD 11/28/15 1330

## 2015-11-28 ENCOUNTER — Encounter: Payer: Self-pay | Admitting: Gastroenterology

## 2015-11-28 DIAGNOSIS — IMO0001 Reserved for inherently not codable concepts without codable children: Secondary | ICD-10-CM | POA: Insufficient documentation

## 2015-11-28 DIAGNOSIS — R140 Abdominal distension (gaseous): Secondary | ICD-10-CM | POA: Insufficient documentation

## 2015-11-28 DIAGNOSIS — R195 Other fecal abnormalities: Secondary | ICD-10-CM | POA: Insufficient documentation

## 2015-11-28 DIAGNOSIS — R194 Change in bowel habit: Secondary | ICD-10-CM | POA: Insufficient documentation

## 2015-11-28 NOTE — Progress Notes (Signed)
[id number] Debra Burgess [id number] 1946-01-07   HISTORY OF PRESENT ILLNESS:  This is a 70 year old female who is new to our practice. She was referred here by her PCP, [name], NP, for evaluation of diarrhea, abdominal discomfort, bloating/gas, and borborygmi.  The last colonoscopy that we have for her was in September 2009 at [facility] by Dr. Sharlot Gowda, which was normal at that time. She thinks that she's had a more recent study than that, but we do not have those records. She said that she will check for them home and see if she has them available for [place]. Anyway, she was also found to have a positive Cologuard study recently as well.  She did have a CT scan of the abdomen and pelvis with contrast in April, which showed only a small hiatal hernia and mild sigmoid diverticulosis. Recent labs show fairly unremarkable CBC, CMP, and lipase.  After speaking with her she does tell me that she is under a significant amount of stress. 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 During her conversation she admits that some GI symptoms may have started back then but just seem worse recently. 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 She says that she retired once but missed the students so had to go back to work, which is when she relocated here near some family and started this new position at [employer].    She says that she's had intermittent diarrhea for at least 5 weeks or so.  She says that it usually follows eating so she is careful not to eat much during the day while she is at work.  Has a lot of rumbling in her stomach.  Has discomfort but no pain per se.  She denies any travel or antibiotic use (except for currently is on Keflex for UTI).     Past Medical History  Diagnosis Date  . Arthritis   . History of chicken pox   . MVP (mitral valve prolapse)   .  Hypertension   . GERD (gastroesophageal reflux disease)   . Basal cell carcinoma   . Hyperlipidemia   . Mononucleosis     2/15  . Osteopenia 04/30/2015  . Hiatal hernia   . Diverticulosis    Past Surgical History  Procedure Laterality Date  . Tonsillectomy and adenoidectomy  1952  . Breast surgery Bilateral 2010    breast reduction  . Hiatal hernia repair  2015    "severe hiatal hernia repair", robotic repair with pig skin sheath    reports that she has never smoked. She has never used smokeless tobacco. She reports that she drinks alcohol. She reports that she does not use illicit drugs. family history includes Arthritis in her maternal grandmother, mother, and sister; Breast cancer in her sister; Colon polyps in her mother; Diabetes in her cousin; Hypertension in her mother; Ovarian cancer in her sister; Prostate cancer in her father. Allergies  Allergen Reactions  . Mango Flavor Anaphylaxis     Outpatient Encounter Prescriptions as of 11/16/2015  Medication Sig  . aspirin 81 MG chewable tablet Chew 81 mg by mouth. 3 times a week  . atorvastatin (LIPITOR) 10 MG tablet Take 1 tablet (10 mg total) by mouth every other day.  . cephALEXin (KEFLEX) 500 MG capsule Take 1 capsule (500 mg total) by mouth 3 (three) times daily.  Marland Kitchen  metoprolol tartrate (LOPRESSOR) 25 MG tablet Take 1 tablet (25 mg total) by mouth at bedtime.  . Omega-3 Fatty Acids (FISH OIL) 1000 MG CAPS Take 1 capsule (1,000 mg total) by mouth 2 (two) times daily.  . valsartan (DIOVAN) 160 MG tablet Take 0.5 tablets (80 mg total) by mouth daily.   No facility-administered encounter medications on file as of 11/16/2015.    REVIEW OF SYSTEMS  : All other systems reviewed and negative except where noted in the History of Present Illness.   PHYSICAL EXAM: BP 132/74 mmHg  Pulse 68  Ht 5' 5.5" (1.664 m)  Wt 188 lb 2 oz (85.333 kg)  BMI 30.82 kg/m2  LMP 10/15/1997 General: Well developed white female in no acute  distress Head: Normocephalic and atraumatic Eyes:  Sclerae anicteric, conjunctiva pink. Ears: Normal auditory acuity Lungs: Clear throughout to auscultation Heart: Regular rate and rhythm Abdomen: Soft, non-distended.  Normal bowel sounds.  Non-tender. Rectal:  Will be done at the time of colonoscopy. Musculoskeletal: Symmetrical with no gross deformities  Skin: No lesions on visible extremities Extremities: No edema  Neurological: Alert oriented x 4, grossly non-focal Psychological:  Alert and cooperative. Normal mood and affect  ASSESSMENT AND PLAN: -70 year old female with complaints of diarrhea/loose stools, gas/bloating, and abdominal discomfort that has been worse over the last several weeks, but some of this may be dating back several months. Question if this is some IBS related to some life changes/stressors. She did also have a positive Cologuard study recently, however. We will schedule her for colonoscopy with Dr. Loletha Carrow.  The risks, benefits, and alternatives to colonoscopy were discussed with the patient and she consents to proceed.    CC:  [name], NP

## 2015-11-28 NOTE — Progress Notes (Signed)
Thank you for sending this case to me. I have reviewed the entire note, and the outlined plan seems appropriate.   Btw, i am not the supervising doc for today, but I assume I must just have had the next available open colonoscopy slot

## 2015-12-04 ENCOUNTER — Ambulatory Visit (AMBULATORY_SURGERY_CENTER): Payer: Commercial Managed Care - PPO | Admitting: Gastroenterology

## 2015-12-04 ENCOUNTER — Encounter: Payer: Self-pay | Admitting: Gastroenterology

## 2015-12-04 VITALS — BP 124/65 | HR 50 | Temp 98.4°F | Resp 13 | Ht 65.5 in | Wt 188.0 lb

## 2015-12-04 DIAGNOSIS — R195 Other fecal abnormalities: Secondary | ICD-10-CM | POA: Diagnosis not present

## 2015-12-04 DIAGNOSIS — R194 Change in bowel habit: Secondary | ICD-10-CM | POA: Diagnosis not present

## 2015-12-04 DIAGNOSIS — K219 Gastro-esophageal reflux disease without esophagitis: Secondary | ICD-10-CM | POA: Diagnosis not present

## 2015-12-04 DIAGNOSIS — I10 Essential (primary) hypertension: Secondary | ICD-10-CM | POA: Diagnosis not present

## 2015-12-04 MED ORDER — SODIUM CHLORIDE 0.9 % IV SOLN: 500.0000 mL | INTRAVENOUS | Status: DC

## 2015-12-04 NOTE — Progress Notes (Signed)
Report given to PACU RN, vss 

## 2015-12-04 NOTE — Op Note (Addendum)
[place] [place] Patient Name: Debra Burgess Procedure Date: 12/04/2015 1:52 PM MRN: [id number] Endoscopist: Mallie Mussel L. Loletha Carrow , MD Age: 70 Referring MD:  Date of Birth: [birth year] Gender: Female Account #: [id number] Procedure:                Colonoscopy Indications:              Positive Cologuard test, Change in bowel habits Medicines:                Monitored Anesthesia Care Procedure:                Pre-Anesthesia Assessment:                           - Prior to the procedure, a History and Physical                            was performed, and patient medications and                            allergies were reviewed. The patient's tolerance of                            previous anesthesia was also reviewed. The risks                            and benefits of the procedure and the sedation                            options and risks were discussed with the patient.                            All questions were answered, and informed consent                            was obtained. Prior Anticoagulants: The patient has                            taken no previous anticoagulant or antiplatelet                            agents. ASA Grade Assessment: II - A patient with                            mild systemic disease. After reviewing the risks                            and benefits, the patient was deemed in                            satisfactory condition to undergo the procedure.                           After obtaining informed consent, the colonoscope  was passed under direct vision. Throughout the                            procedure, the patient's blood pressure, pulse, and                            oxygen saturations were monitored continuously. The                            Model CF-HQ190L [id number] scope was introduced                            through the anus and advanced to the the cecum,                            identified by  appendiceal orifice and ileocecal                            valve. The colonoscopy was performed without                            difficulty. The patient tolerated the procedure                            well. The quality of the bowel preparation was                            excellent. The ileocecal valve, appendiceal                            orifice, and rectum were photographed. Scope In: 2:09:08 PM Scope Out: 2:23:29 PM Scope Withdrawal Time: 0 hours 9 minutes 44 seconds  Total Procedure Duration: 0 hours 14 minutes 21 seconds  Findings:                 The perianal and digital rectal examinations were                            normal.                           Scattered small-mouthed diverticula were found in                            the left colon.                           The exam was otherwise without abnormality. Complications:            No immediate complications. Estimated Blood Loss:     Estimated blood loss: none. Impression:               - Diverticulosis in the left colon.                           - The examination was otherwise normal.                           -  No specimens collected. Recommendation:           - Patient has a contact number available for                            emergencies. The signs and symptoms of potential                            delayed complications were discussed with the                            patient. Return to normal activities tomorrow.                            Written discharge instructions were provided to the                            patient.                           - Resume previous diet.                           - Continue present medications.                           - Repeat colonoscopy in 10 years for screening                            purposes. [name], MD 12/04/2015 2:52:45 PM This report has been signed electronically.

## 2015-12-04 NOTE — Patient Instructions (Signed)
YOU HAD AN ENDOSCOPIC PROCEDURE TODAY AT THE [facility]:   Refer to the procedure report that was given to you for any specific questions about what was found during the examination.  If the procedure report does not answer your questions, please call your gastroenterologist to clarify.  If you requested that your care partner not be given the details of your procedure findings, then the procedure report has been included in a sealed envelope for you to review at your convenience later.  YOU SHOULD EXPECT: Some feelings of bloating in the abdomen. Passage of more gas than usual.  Walking can help get rid of the air that was put into your GI tract during the procedure and reduce the bloating. If you had a lower endoscopy (such as a colonoscopy or flexible sigmoidoscopy) you may notice spotting of blood in your stool or on the toilet paper. If you underwent a bowel prep for your procedure, you may not have a normal bowel movement for a few days.  Please Note:  You might notice some irritation and congestion in your nose or some drainage.  This is from the oxygen used during your procedure.  There is no need for concern and it should clear up in a day or so.  SYMPTOMS TO REPORT IMMEDIATELY:   Following lower endoscopy (colonoscopy or flexible sigmoidoscopy):  Excessive amounts of blood in the stool  Significant tenderness or worsening of abdominal pains  Swelling of the abdomen that is new, acute  Fever of 100F or higher   For urgent or emergent issues, a gastroenterologist can be reached at any hour by calling [phone].   DIET: Your first meal following the procedure should be a small meal and then it is ok to progress to your normal diet. Heavy or fried foods are harder to digest and may make you feel nauseous or bloated.  Likewise, meals heavy in dairy and vegetables can increase bloating.  Drink plenty of fluids but you should avoid alcoholic beverages for 24  hours.  ACTIVITY:  You should plan to take it easy for the rest of today and you should NOT DRIVE or use heavy machinery until tomorrow (because of the sedation medicines used during the test).    FOLLOW UP: Our staff will call the number listed on your records the next business day following your procedure to check on you and address any questions or concerns that you may have regarding the information given to you following your procedure. If we do not reach you, we will leave a message.  However, if you are feeling well and you are not experiencing any problems, there is no need to return our call.  We will assume that you have returned to your regular daily activities without incident.  If any biopsies were taken you will be contacted by phone or by letter within the next 1-3 weeks.  Please call us at [phone] if you have not heard about the biopsies in 3 weeks.    SIGNATURES/CONFIDENTIALITY: You and/or your care partner have signed paperwork which will be entered into your electronic medical record.  These signatures attest to the fact that that the information above on your After Visit Summary has been reviewed and is understood.  Full responsibility of the confidentiality of this discharge information lies with you and/or your care-partner. 

## 2015-12-05 ENCOUNTER — Telehealth: Payer: Self-pay

## 2015-12-05 NOTE — Telephone Encounter (Signed)
Attempt post procedure follow up call, no answer, left voice mail message. Attempt post procedure follow up call, no answer, left voice mail message.

## 2016-01-02 ENCOUNTER — Encounter: Payer: Self-pay | Admitting: Cardiology

## 2016-01-03 ENCOUNTER — Telehealth: Payer: Self-pay | Admitting: *Deleted

## 2016-01-03 NOTE — Telephone Encounter (Signed)
Spoke with pt, she had an episode yesterday of chest tightness, SOB and sweating. She also reports her bp has been running higher than usual. She has felt fine today but would like to be seen asap. Follow up scheduled

## 2016-01-10 ENCOUNTER — Encounter: Payer: Self-pay | Admitting: Nurse Practitioner

## 2016-01-10 ENCOUNTER — Ambulatory Visit (INDEPENDENT_AMBULATORY_CARE_PROVIDER_SITE_OTHER): Payer: Commercial Managed Care - PPO | Admitting: Nurse Practitioner

## 2016-01-10 VITALS — BP 160/100 | Ht 65.0 in | Wt 187.0 lb

## 2016-01-10 DIAGNOSIS — R0789 Other chest pain: Secondary | ICD-10-CM | POA: Diagnosis not present

## 2016-01-10 DIAGNOSIS — I340 Nonrheumatic mitral (valve) insufficiency: Secondary | ICD-10-CM

## 2016-01-10 DIAGNOSIS — I10 Essential (primary) hypertension: Secondary | ICD-10-CM

## 2016-01-10 DIAGNOSIS — E785 Hyperlipidemia, unspecified: Secondary | ICD-10-CM | POA: Diagnosis not present

## 2016-01-10 MED ORDER — VALSARTAN 160 MG PO TABS: 160.0000 mg | ORAL_TABLET | Freq: Every day | ORAL | 3 refills | Status: DC

## 2016-01-10 MED ORDER — ATORVASTATIN CALCIUM 10 MG PO TABS: 10.0000 mg | ORAL_TABLET | ORAL | 3 refills | Status: DC

## 2016-01-10 MED ORDER — METOPROLOL TARTRATE 25 MG PO TABS: 25.0000 mg | ORAL_TABLET | Freq: Every day | ORAL | 3 refills | Status: DC

## 2016-01-10 NOTE — Patient Instructions (Addendum)
We will be checking the following labs today - NONE   Medication Instructions:    Continue with your current medicines. BUT  I am increasing the Valsartan back to a whole tablet a day  I have refilled your medicines today    Testing/Procedures To Be Arranged:  Stress myoview  Follow-Up:   Will see how your test turns out and then decide about follow up.     Other Special Instructions:   Limit your salt - stop the soy sauce.     If you need a refill on your cardiac medications before your next appointment, please call your pharmacy.   Call the [facility] office at [phone] if you have any questions, problems or concerns.

## 2016-01-10 NOTE — Progress Notes (Signed)
CARDIOLOGY OFFICE NOTE  Date:  [id number]    Debra Burgess Date of Birth: [birth year] Medical Record [id number]  PCP:  Nance Pear., NP  Cardiologist:  Stanford Breed  Chief Complaint  Patient presents with  . Chest Pain    Work in visit - seen for Dr. Stanford Breed    History of Present Illness: Debra Burgess is a 70 y.o. female who presents today for a work in visit. Seen for Dr. Stanford Breed.   She has moved here from [place]. Established with Dr. Stanford Breed back in October for MVP. Outside records showed a nuclear study in February 2012. Ejection fraction was 70%. There was apical thinning but no ischemia. Echocardiogram in 2012 showed normal LV function with no significant valvular heart disease. Carotid Dopplers 2012 showed minimal internal carotid artery disease bilaterally.  Comes back today. Here alone. Very talkative. Has had a spell a week ago where her chest got tight and she was sweating and was short of breath. She was taking care of her grandkids. She had had an excessive amount of caffeine that day. It occurred with walking across the room - lasted about 8 minutes - laid down and it subsided. Few twinges since then - nothing like the original pain. She swims about 30 minutes every Saturday and tries to be active in her garden. Missed her swim this past Saturday. Notes her BP has been creeping up - she is asking about increasing her Diovan back up. She had lost weight but has now gained some back. She has some pain in her knees and prefers to swim. She gets too much sodium - loves to cook and eat soy sauce. Needs meds refilled.   Past Medical History:  Diagnosis Date  . Arthritis   . Basal cell carcinoma   . Diverticulosis   . GERD (gastroesophageal reflux disease)   . Hiatal hernia   . History of chicken pox   . Hyperlipidemia   . Hypertension   . Mononucleosis    2/15  . MVP (mitral valve prolapse)   . Osteopenia 04/30/2015    Past Surgical History:  Procedure  Laterality Date  . BREAST SURGERY Bilateral 2010   breast reduction  . HIATAL HERNIA REPAIR  2015   "severe hiatal hernia repair", robotic repair with pig skin sheath  . TONSILLECTOMY AND ADENOIDECTOMY  1952     Medications: Current Outpatient Prescriptions  Medication Sig Dispense Refill  . aspirin 81 MG chewable tablet Chew 81 mg by mouth. 3 times a week    . atorvastatin (LIPITOR) 10 MG tablet Take 1 tablet (10 mg total) by mouth every other day. 90 tablet 3  . metoprolol tartrate (LOPRESSOR) 25 MG tablet Take 1 tablet (25 mg total) by mouth at bedtime. 90 tablet 3  . NON FORMULARY Take 1 tablet by mouth daily. IB Guard for (IBS) peppermint    . Omega-3 Fatty Acids (FISH OIL) 1000 MG CAPS Take 1 capsule (1,000 mg total) by mouth 2 (two) times daily. 180 capsule 3  . valsartan (DIOVAN) 160 MG tablet Take 0.5 tablets (80 mg total) by mouth daily. 45 tablet 3   No current facility-administered medications for this visit.     Allergies: Allergies  Allergen Reactions  . Mango Flavor Anaphylaxis    Social History: The patient  reports that she has never smoked. She has never used smokeless tobacco. She reports that she drinks alcohol. She reports that she does not use drugs.   Family History: The patient's  family history includes Arthritis in her maternal grandmother, mother, and sister; Breast cancer in her sister; Colon polyps in her mother; Diabetes in her cousin; Hypertension in her mother; Ovarian cancer in her sister; Prostate cancer in her father.   Review of Systems: Please see the history of present illness.   Otherwise, the review of systems is positive for none.   All other systems are reviewed and negative.   Physical Exam: VS:  BP (!) 160/100   Ht 5\' 5"  (1.651 m)   Wt 187 lb (84.8 kg)   LMP 10/15/1997   BMI 31.12 kg/m  .  BMI Body mass index is 31.12 kg/m.  Wt Readings from Last 3 Encounters:  01/10/16 187 lb (84.8 kg)  12/04/15 188 lb (85.3 kg)  11/16/15  188 lb 2 oz (85.3 kg)    General: Pleasant. Well developed, well nourished and in no acute distress.  She has gained 7 pounds since last visit from October. She is very talkative.  HEENT: Normal.  Neck: Supple, no JVD, carotid bruits, or masses noted.  Cardiac: Regular rate and rhythm. No murmurs, rubs, or gallops. No edema.  Respiratory:  Lungs are clear to auscultation bilaterally with normal work of breathing.  GI: Soft and nontender.  MS: No deformity or atrophy. Gait and ROM intact.  Skin: Warm and dry. Color is normal.  Neuro:  Strength and sensation are intact and no gross focal deficits noted.  Psych: Alert, appropriate and with normal affect.   LABORATORY DATA:  EKG:  EKG is ordered today. This demonstrates NSR - her rate has improved.  Lab Results  Component Value Date   WBC 11.5 (H) 11/15/2015   HGB 13.1 11/15/2015   HCT 39.9 11/15/2015   PLT 304 11/15/2015   GLUCOSE 101 (H) 11/15/2015   CHOL 169 04/10/2015   TRIG 152 (H) 04/10/2015   HDL 55 04/10/2015   LDLCALC 84 04/10/2015   ALT 21 11/15/2015   AST 23 11/15/2015   NA 139 11/15/2015   K 3.8 11/15/2015   CL 107 11/15/2015   CREATININE 0.71 11/15/2015   BUN 14 11/15/2015   CO2 24 11/15/2015    BNP (last 3 results) No results for input(s): BNP in the last 8760 hours.  ProBNP (last 3 results) No results for input(s): PROBNP in the last 8760 hours.   Other Studies Reviewed Today:   Assessment/Plan: 1. Chest pain - with mild exertion and in the setting of uncontrolled HTN. Also with HLD. Will update her Myoview.   2. HLD -  On statin  3. Reported MVP - no MR on last echo  4. HTN - BP is not controlled - she will go back to full dose Valsartan. Advised sodium restriction.   5. Obesity - has gained weight back.   Current medicines are reviewed with the patient today.  The patient does not have concerns regarding medicines other than what has been noted above.  The following changes have been made:   See above.  Labs/ tests ordered today include:    Orders Placed This Encounter  Procedures  . Myocardial Perfusion Imaging  . EKG 12-Lead     Disposition:   Further disposition pending.   Patient is agreeable to this plan and will call if any problems develop in the interim.   Signed: Burtis Junes, RN, ANP-C 01/10/2016 2:31 PM  [facility] [address] [place], [place]  [address] Phone: [phone] Fax: [phone]

## 2016-01-11 ENCOUNTER — Ambulatory Visit (HOSPITAL_COMMUNITY): Payer: Commercial Managed Care - PPO | Attending: Cardiology

## 2016-01-11 DIAGNOSIS — R9439 Abnormal result of other cardiovascular function study: Secondary | ICD-10-CM | POA: Insufficient documentation

## 2016-01-11 DIAGNOSIS — R0789 Other chest pain: Secondary | ICD-10-CM | POA: Diagnosis present

## 2016-01-11 DIAGNOSIS — I10 Essential (primary) hypertension: Secondary | ICD-10-CM | POA: Insufficient documentation

## 2016-01-11 LAB — MYOCARDIAL PERFUSION IMAGING
Estimated workload: 7 METS
Exercise duration (min): 6 min
Exercise duration (sec): 30 s
LV dias vol: 84 mL (ref 46–106)
LV sys vol: 29 mL
MPHR: 153 {beats}/min
Peak HR: 162 {beats}/min
Percent HR: 105 %
RATE: 0.23
Rest HR: 58 {beats}/min
SDS: 2
SRS: 2
SSS: 4
TID: 1

## 2016-01-11 MED ORDER — TECHNETIUM TC 99M TETROFOSMIN IV KIT
33.0000 | PACK | Freq: Once | INTRAVENOUS | Status: AC | PRN
  Administered 2016-01-11: 33 via INTRAVENOUS
  Filled 2016-01-11: qty 33

## 2016-01-11 MED ORDER — TECHNETIUM TC 99M TETROFOSMIN IV KIT
11.0000 | PACK | Freq: Once | INTRAVENOUS | Status: AC | PRN
  Administered 2016-01-11: 11 via INTRAVENOUS
  Filled 2016-01-11: qty 11

## 2016-01-12 ENCOUNTER — Telehealth: Payer: Self-pay | Admitting: Family

## 2016-01-12 NOTE — Telephone Encounter (Signed)
Please contact pt to arrange follow up to address elevated blood pressure during stress test.

## 2016-01-17 NOTE — Telephone Encounter (Signed)
Left vm for pt to call our office or schedule appt through Mychart with PCP.

## 2016-01-22 NOTE — Telephone Encounter (Signed)
Could you please send her a recall letter?  thanks

## 2016-01-27 DIAGNOSIS — I10 Essential (primary) hypertension: Secondary | ICD-10-CM | POA: Diagnosis not present

## 2016-01-27 DIAGNOSIS — R300 Dysuria: Secondary | ICD-10-CM | POA: Diagnosis not present

## 2016-02-02 NOTE — Telephone Encounter (Signed)
Letter mailed to pt.  

## 2016-02-03 ENCOUNTER — Encounter: Payer: Self-pay | Admitting: Family

## 2016-02-05 ENCOUNTER — Encounter: Payer: Self-pay | Admitting: Family Medicine

## 2016-02-05 ENCOUNTER — Ambulatory Visit (INDEPENDENT_AMBULATORY_CARE_PROVIDER_SITE_OTHER): Payer: Commercial Managed Care - PPO | Admitting: Family Medicine

## 2016-02-05 ENCOUNTER — Ambulatory Visit (HOSPITAL_BASED_OUTPATIENT_CLINIC_OR_DEPARTMENT_OTHER)
Admission: RE | Admit: 2016-02-05 | Discharge: 2016-02-05 | Disposition: A | Discharge status: Home / Self Care | Payer: Commercial Managed Care - PPO | Source: Ambulatory Visit | Attending: Family Medicine | Admitting: Family Medicine

## 2016-02-05 VITALS — BP 132/86 | HR 65 | Temp 97.4°F | Ht 65.5 in | Wt 189.4 lb

## 2016-02-05 DIAGNOSIS — D72829 Elevated white blood cell count, unspecified: Secondary | ICD-10-CM | POA: Diagnosis not present

## 2016-02-05 DIAGNOSIS — R0781 Pleurodynia: Secondary | ICD-10-CM | POA: Diagnosis not present

## 2016-02-05 DIAGNOSIS — R634 Abnormal weight loss: Secondary | ICD-10-CM | POA: Diagnosis not present

## 2016-02-05 DIAGNOSIS — R221 Localized swelling, mass and lump, neck: Secondary | ICD-10-CM | POA: Diagnosis not present

## 2016-02-05 NOTE — Patient Instructions (Signed)
It was good to see you today- I will be in touch with your labs and we will set up an ultrasound of your thyroid for you Please go to lab and then to the ground floor to have your ribs x-rayed.  I'll be in touch via mychart asap Let me know if any changes or concerns in the meantime

## 2016-02-05 NOTE — Progress Notes (Signed)
Pre visit review using our clinic review tool, if applicable. No additional management support is needed unless otherwise documented below in the visit note. 

## 2016-02-05 NOTE — Progress Notes (Signed)
[facility] [address] [place], [place] [address] [phone]  Date:  [id number]   Name:  Debra Burgess   DOB:  1945/09/22   MRN:  [id number]  PCP:  Nance Pear., NP    Chief Complaint: Thyroid Problem (c/o elevated blood pressure, goiter in neck, swetas, rapid heart rate, hair shedding and fatigue. )   History of Present Illness:  Debra Burgess is a 70 y.o. very pleasant female patient who presents with the following:  She is here today as she is concerned about possible hyperthyroidism. She feels like her neck is enlarged.  It appeared much larger to her on Friday evening (today is Monday). Today it is better but she still feels like it is larger than usual.   She also notes other symptoms that have made her concerned about her thyroid- she has noted that she feels hungry, she seems to be losing weight, and she feels like her hair is thinning, and her nails are weaker than usual.  She has felt jittery and noted some bad dreams.    She is not aware of any thyroid issues in the past- however her mother did have hyperthyroidism.    She has had pain under her left breast off an on for 6 months- it hurts to press on it.  She has not noted any rash on the area.  She notes that pain more when she lies down at night.  Not worse with exertion No SOB She just had a negative stress test about 3 weeks ago.  No LE edema  Her GI issues- diarrhea, bloating- are still present but somewhat better She did have a colonoscopy recently that was normal   She did have a breast reduction about 20 years ago, and a hiatal hernia repair nearly 2 years ago  Wt Readings from Last 3 Encounters:  02/05/16 189 lb 6.4 oz (85.9 kg)  01/10/16 187 lb (84.8 kg)  12/04/15 188 lb (85.3 kg)   No dramatic weight change Patient Active Problem List   Diagnosis Date Noted  . Nonspecific abnormal finding in stool contents 11/28/2015  . Change  in bowel habits 11/28/2015  . Gas 11/28/2015  . Bloating 11/28/2015  . Osteopenia 04/30/2015  . Palpitations 04/10/2015  . Hyperlipidemia 03/16/2015  . GERD (gastroesophageal reflux disease) 03/16/2015  . Mitral valve regurgitation 03/16/2015  . HTN (hypertension) 03/16/2015    Past Medical History:  Diagnosis Date  . Arthritis   . Basal cell carcinoma   . Diverticulosis   . GERD (gastroesophageal reflux disease)   . Hiatal hernia   . History of chicken pox   . Hyperlipidemia   . Hypertension   . Mononucleosis    2/15  . MVP (mitral valve prolapse)   . Osteopenia 04/30/2015    Past Surgical History:  Procedure Laterality Date  . BREAST SURGERY Bilateral 2010   breast reduction  . HIATAL HERNIA REPAIR  2015   "severe hiatal hernia repair", robotic repair with pig skin sheath  . TONSILLECTOMY AND ADENOIDECTOMY  1952    Social History  Substance Use Topics  . Smoking status: Never Smoker  . Smokeless tobacco: Never Used  . Alcohol use 0.0 oz/week     Comment: Rarely    Family History  Problem Relation Age of Onset  . Hypertension Mother   . Arthritis Mother   . Prostate cancer Father   . Arthritis Sister   . Arthritis  Maternal Grandmother   . Ovarian cancer Sister     ovarian and breast  . Diabetes Cousin     maternal  . Breast cancer Sister   . Colon polyps Mother     had 2 surgeries    Allergies  Allergen Reactions  . Mango Flavor Anaphylaxis    Medication list has been reviewed and updated.  Current Outpatient Prescriptions on File Prior to Visit  Medication Sig Dispense Refill  . aspirin 81 MG chewable tablet Chew 81 mg by mouth. 3 times a week    . atorvastatin (LIPITOR) 10 MG tablet Take 1 tablet (10 mg total) by mouth every other day. 90 tablet 3  . metoprolol tartrate (LOPRESSOR) 25 MG tablet Take 1 tablet (25 mg total) by mouth at bedtime. 90 tablet 3  . NON FORMULARY Take 1 tablet by mouth daily. IB Guard for (IBS) peppermint    .  Omega-3 Fatty Acids (FISH OIL) 1000 MG CAPS Take 1 capsule (1,000 mg total) by mouth 2 (two) times daily. 180 capsule 3  . valsartan (DIOVAN) 160 MG tablet Take 1 tablet (160 mg total) by mouth daily. 90 tablet 3   No current facility-administered medications on file prior to visit.     Review of Systems:  As per HPI- otherwise negative.   Physical Examination: Vitals:   02/05/16 1514  BP: 132/86  Pulse: 65  Temp: 97.4 F (36.3 C)   Vitals:   02/05/16 1514  Weight: 189 lb 6.4 oz (85.9 kg)  Height: 5' 5.5" (1.664 m)   Body mass index is 31.04 kg/m. Ideal Body Weight: Weight in (lb) to have BMI = 25: 152.2  GEN: WDWN, NAD, Non-toxic, A & O x 3, overweight, looks well HEENT: Atraumatic, Normocephalic. Neck supple. No masses, No LAD.  Bilateral TM wnl, oropharynx normal.  PEERL,EOMI.   Her thyroid is non- tender.  I do not appreciate any goiter or nodule at this time Ears and Nose: No external deformity. CV: RRR, No M/G/R. No JVD. No thrill. No extra heart sounds. PULM: CTA B, no wheezes, crackles, rhonchi. No retractions. No resp. distress. No accessory muscle use. ABD: S, NT, ND. No rebound. No HSM. She is able to pinpoint the tender area on the anterior left ribs just under her left breast.  Scars from breast reduction and lap hernia repair are adjacent- however the scars do not seem to be tender.   No redness or skin lesion noted  EXTR: No c/c/e NEURO Normal gait.  PSYCH: Normally interactive. Conversant. Not depressed or anxious appearing.  Calm demeanor.    Assessment and Plan: Rib pain on left side - Plan: DG Ribs Unilateral W/Chest Left  Leukocytosis - Plan: CBC  Loss of weight - Plan: TSH, T3, free, T4, free, Hemoglobin A1c  Neck fullness - Plan: TSH, T3, free, T4, free, US Soft Tissue Head/Neck  Here today with concern of hyperthyroidism.  At this time I do not appreciate any overt signs of this but we can certainly check labs and do a thyroid US for her Also  will check rib films for apparent MSK pain in the left ribs.  The pain is reproducible and she had a negative stress within 30 days so I am not concerned about a cardiac etiology  Will plan further follow- up pending labs.   Signed Lamar Blinks, MD

## 2016-02-05 NOTE — Telephone Encounter (Signed)
Pt called and given a 3:15 appt with me today

## 2016-02-06 LAB — CBC
HEMATOCRIT: 39.5 % (ref 36.0–46.0)
HEMOGLOBIN: 13.4 g/dL (ref 12.0–15.0)
MCHC: 33.8 g/dL (ref 30.0–36.0)
MCV: 88.3 fl (ref 78.0–100.0)
PLATELETS: 306 10*3/uL (ref 150.0–400.0)
RBC: 4.47 Mil/uL (ref 3.87–5.11)
RDW: 13.7 % (ref 11.5–15.5)
WBC: 12.4 10*3/uL — ABNORMAL HIGH (ref 4.0–10.5)

## 2016-02-06 LAB — T4, FREE: FREE T4: 0.79 ng/dL (ref 0.60–1.60)

## 2016-02-06 LAB — TSH: TSH: 0.9 u[IU]/mL (ref 0.35–4.50)

## 2016-02-06 LAB — HEMOGLOBIN A1C: HEMOGLOBIN A1C: 5.7 % (ref 4.6–6.5)

## 2016-02-06 LAB — T3, FREE: T3, Free: 3.4 pg/mL (ref 2.3–4.2)

## 2016-02-07 ENCOUNTER — Ambulatory Visit (HOSPITAL_BASED_OUTPATIENT_CLINIC_OR_DEPARTMENT_OTHER)
Admission: RE | Admit: 2016-02-07 | Discharge: 2016-02-07 | Disposition: A | Discharge status: Home / Self Care | Payer: Commercial Managed Care - PPO | Source: Ambulatory Visit | Attending: Family Medicine | Admitting: Family Medicine

## 2016-02-07 DIAGNOSIS — R221 Localized swelling, mass and lump, neck: Secondary | ICD-10-CM | POA: Diagnosis not present

## 2016-02-09 ENCOUNTER — Other Ambulatory Visit: Payer: Self-pay | Admitting: Family Medicine

## 2016-02-09 DIAGNOSIS — R221 Localized swelling, mass and lump, neck: Secondary | ICD-10-CM

## 2016-02-09 DIAGNOSIS — L821 Other seborrheic keratosis: Secondary | ICD-10-CM | POA: Diagnosis not present

## 2016-02-09 DIAGNOSIS — L910 Hypertrophic scar: Secondary | ICD-10-CM | POA: Diagnosis not present

## 2016-02-09 DIAGNOSIS — L820 Inflamed seborrheic keratosis: Secondary | ICD-10-CM | POA: Diagnosis not present

## 2016-02-12 ENCOUNTER — Encounter: Payer: Self-pay | Admitting: Family Medicine

## 2016-02-12 DIAGNOSIS — R221 Localized swelling, mass and lump, neck: Secondary | ICD-10-CM

## 2016-02-13 ENCOUNTER — Other Ambulatory Visit: Payer: Self-pay | Admitting: Emergency Medicine

## 2016-02-13 DIAGNOSIS — R221 Localized swelling, mass and lump, neck: Secondary | ICD-10-CM

## 2016-02-13 NOTE — Telephone Encounter (Signed)
Called pt to schedule lab appt so that pt can come in for a BUN before her MRI/CT scan. No answer, left voicemail for pt to call back to schedule lab only appt. Orders for lab work already entered.

## 2016-02-14 ENCOUNTER — Other Ambulatory Visit: Payer: Self-pay | Admitting: Emergency Medicine

## 2016-02-14 DIAGNOSIS — R221 Localized swelling, mass and lump, neck: Secondary | ICD-10-CM

## 2016-02-15 ENCOUNTER — Other Ambulatory Visit: Payer: Commercial Managed Care - PPO

## 2016-02-15 ENCOUNTER — Other Ambulatory Visit (INDEPENDENT_AMBULATORY_CARE_PROVIDER_SITE_OTHER): Payer: Commercial Managed Care - PPO

## 2016-02-15 DIAGNOSIS — R221 Localized swelling, mass and lump, neck: Secondary | ICD-10-CM

## 2016-02-16 LAB — BASIC METABOLIC PANEL
BUN: 24 mg/dL — ABNORMAL HIGH (ref 6–23)
CALCIUM: 9.3 mg/dL (ref 8.4–10.5)
CHLORIDE: 107 meq/L (ref 96–112)
CO2: 24 meq/L (ref 19–32)
Creatinine, Ser: 1.06 mg/dL (ref 0.40–1.20)
GFR: 54.81 mL/min — ABNORMAL LOW (ref >60.00)
Glucose, Bld: 97 mg/dL (ref 70–99)
Potassium: 4.2 mEq/L (ref 3.5–5.1)
SODIUM: 139 meq/L (ref 135–145)

## 2016-02-16 LAB — BUN: BUN: 24 mg/dL — AB (ref 6–23)

## 2016-02-21 ENCOUNTER — Encounter (HOSPITAL_BASED_OUTPATIENT_CLINIC_OR_DEPARTMENT_OTHER): Payer: Self-pay

## 2016-02-21 ENCOUNTER — Ambulatory Visit (HOSPITAL_BASED_OUTPATIENT_CLINIC_OR_DEPARTMENT_OTHER)
Admission: RE | Admit: 2016-02-21 | Discharge: 2016-02-21 | Disposition: A | Discharge status: Home / Self Care | Payer: Commercial Managed Care - PPO | Source: Ambulatory Visit | Attending: Family Medicine | Admitting: Family Medicine

## 2016-02-21 DIAGNOSIS — Q892 Congenital malformations of other endocrine glands: Secondary | ICD-10-CM | POA: Diagnosis not present

## 2016-02-21 DIAGNOSIS — R221 Localized swelling, mass and lump, neck: Secondary | ICD-10-CM | POA: Insufficient documentation

## 2016-02-21 DIAGNOSIS — M47892 Other spondylosis, cervical region: Secondary | ICD-10-CM | POA: Diagnosis not present

## 2016-02-21 HISTORY — DX: Malignant (primary) neoplasm, unspecified: C80.1

## 2016-02-21 MED ORDER — IOPAMIDOL (ISOVUE-300) INJECTION 61%
75.0000 mL | Freq: Once | INTRAVENOUS | Status: AC | PRN
  Administered 2016-02-21: 75 mL via INTRAVENOUS

## 2016-02-21 NOTE — Telephone Encounter (Signed)
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  She has started to notice a subtle voice change,  I will refer to ENT for likely operative removal. She states understanding and will let me know if she does not hear about this appt soon.  She is not have any difficulty breathing

## 2016-02-28 ENCOUNTER — Encounter: Payer: Self-pay | Admitting: Family Medicine

## 2016-04-03 ENCOUNTER — Telehealth: Payer: Self-pay | Admitting: Family

## 2016-04-03 NOTE — Telephone Encounter (Signed)
Caller name: Relationship to patient: Self Can be reached: [phone] Pharmacy:  Reason for call: Request order for 2nd Pneumonia Vac. States she needs it before the end of October.

## 2016-04-03 NOTE — Telephone Encounter (Signed)
Patient scheduled.

## 2016-04-03 NOTE — Telephone Encounter (Signed)
Melissa-- I see documentation of prevnar 13 on 03/13/15.  Please review/advise?

## 2016-04-03 NOTE — Telephone Encounter (Signed)
Lets please bring pt for nurse visit for pneumovax 23. Thanks.

## 2016-04-05 ENCOUNTER — Ambulatory Visit (INDEPENDENT_AMBULATORY_CARE_PROVIDER_SITE_OTHER): Payer: Commercial Managed Care - PPO

## 2016-04-05 DIAGNOSIS — Z23 Encounter for immunization: Secondary | ICD-10-CM

## 2016-04-05 NOTE — Progress Notes (Signed)
Pre visit review using our clinic tool,if applicable. No additional management support is needed unless otherwise documented below in the visit note.  

## 2016-04-19 DIAGNOSIS — Z23 Encounter for immunization: Secondary | ICD-10-CM | POA: Diagnosis not present

## 2016-05-21 DIAGNOSIS — Q892 Congenital malformations of other endocrine glands: Secondary | ICD-10-CM | POA: Diagnosis not present

## 2016-05-23 DIAGNOSIS — Q892 Congenital malformations of other endocrine glands: Secondary | ICD-10-CM | POA: Diagnosis not present

## 2016-06-17 HISTORY — PX: CYST EXCISION: SHX5701

## 2016-07-28 DIAGNOSIS — J029 Acute pharyngitis, unspecified: Secondary | ICD-10-CM | POA: Diagnosis not present

## 2016-09-02 ENCOUNTER — Telehealth: Payer: Self-pay | Admitting: *Deleted

## 2016-09-02 ENCOUNTER — Encounter (HOSPITAL_COMMUNITY)
Admission: EM | Disposition: A | Discharge status: Home / Self Care | Payer: Self-pay | Source: Home / Self Care | Attending: Internal Medicine

## 2016-09-02 ENCOUNTER — Inpatient Hospital Stay (HOSPITAL_BASED_OUTPATIENT_CLINIC_OR_DEPARTMENT_OTHER)
Admission: EM | Admit: 2016-09-02 | Discharge: 2016-09-03 | DRG: 287 | Disposition: A | Discharge status: Home / Self Care | Payer: Commercial Managed Care - PPO | Attending: Internal Medicine | Admitting: Internal Medicine

## 2016-09-02 ENCOUNTER — Encounter (HOSPITAL_BASED_OUTPATIENT_CLINIC_OR_DEPARTMENT_OTHER): Payer: Self-pay | Admitting: Emergency Medicine

## 2016-09-02 ENCOUNTER — Emergency Department (HOSPITAL_BASED_OUTPATIENT_CLINIC_OR_DEPARTMENT_OTHER): Payer: Commercial Managed Care - PPO

## 2016-09-02 DIAGNOSIS — R079 Chest pain, unspecified: Principal | ICD-10-CM | POA: Diagnosis present

## 2016-09-02 DIAGNOSIS — I214 Non-ST elevation (NSTEMI) myocardial infarction: Secondary | ICD-10-CM

## 2016-09-02 DIAGNOSIS — Z7982 Long term (current) use of aspirin: Secondary | ICD-10-CM | POA: Diagnosis not present

## 2016-09-02 DIAGNOSIS — I10 Essential (primary) hypertension: Secondary | ICD-10-CM | POA: Diagnosis present

## 2016-09-02 DIAGNOSIS — Z859 Personal history of malignant neoplasm, unspecified: Secondary | ICD-10-CM

## 2016-09-02 DIAGNOSIS — I2510 Atherosclerotic heart disease of native coronary artery without angina pectoris: Secondary | ICD-10-CM | POA: Diagnosis present

## 2016-09-02 DIAGNOSIS — E785 Hyperlipidemia, unspecified: Secondary | ICD-10-CM | POA: Diagnosis present

## 2016-09-02 DIAGNOSIS — Z79899 Other long term (current) drug therapy: Secondary | ICD-10-CM

## 2016-09-02 DIAGNOSIS — I25110 Atherosclerotic heart disease of native coronary artery with unstable angina pectoris: Secondary | ICD-10-CM | POA: Diagnosis not present

## 2016-09-02 DIAGNOSIS — R9431 Abnormal electrocardiogram [ECG] [EKG]: Secondary | ICD-10-CM

## 2016-09-02 DIAGNOSIS — R072 Precordial pain: Secondary | ICD-10-CM | POA: Diagnosis not present

## 2016-09-02 HISTORY — PX: LEFT HEART CATH AND CORONARY ANGIOGRAPHY: CATH118249

## 2016-09-02 HISTORY — PX: CORONARY ANGIOGRAPHY: CATH118303

## 2016-09-02 LAB — BASIC METABOLIC PANEL
Anion gap: 10 (ref 5–15)
BUN: 12 mg/dL (ref 6–20)
CHLORIDE: 105 mmol/L (ref 101–111)
CO2: 24 mmol/L (ref 22–32)
Calcium: 9.7 mg/dL (ref 8.9–10.3)
Creatinine, Ser: 0.83 mg/dL (ref 0.44–1.00)
GFR calc non Af Amer: >60 mL/min (ref >60)
GLUCOSE: 100 mg/dL — AB (ref 65–99)
Potassium: 4 mmol/L (ref 3.5–5.1)
Sodium: 139 mmol/L (ref 135–145)

## 2016-09-02 LAB — D-DIMER, QUANTITATIVE (NOT AT ARMC)

## 2016-09-02 LAB — CBC
HCT: 41.2 % (ref 36.0–46.0)
Hemoglobin: 13.7 g/dL (ref 12.0–15.0)
MCH: 29.3 pg (ref 26.0–34.0)
MCHC: 33.3 g/dL (ref 30.0–36.0)
MCV: 88 fL (ref 78.0–100.0)
Platelets: 326 10*3/uL (ref 150–400)
RBC: 4.68 MIL/uL (ref 3.87–5.11)
RDW: 13.7 % (ref 11.5–15.5)
WBC: 10.5 10*3/uL (ref 4.0–10.5)

## 2016-09-02 LAB — HEPATIC FUNCTION PANEL
ALK PHOS: 89 U/L (ref 38–126)
ALT: 31 U/L (ref 14–54)
AST: 29 U/L (ref 15–41)
Albumin: 4.2 g/dL (ref 3.5–5.0)
BILIRUBIN DIRECT: 0.1 mg/dL (ref 0.1–0.5)
Indirect Bilirubin: 0.4 mg/dL (ref 0.3–0.9)
Total Bilirubin: 0.5 mg/dL (ref 0.3–1.2)
Total Protein: 7.7 g/dL (ref 6.5–8.1)

## 2016-09-02 LAB — LIPASE, BLOOD
LIPASE: 13 U/L (ref 11–51)
Lipase: 23 U/L (ref 11–51)

## 2016-09-02 LAB — AMYLASE: Amylase: 40 U/L (ref 28–100)

## 2016-09-02 LAB — TROPONIN I: Troponin I: <0.03 ng/mL (ref <0.03)

## 2016-09-02 LAB — MRSA PCR SCREENING: MRSA by PCR: NEGATIVE

## 2016-09-02 SURGERY — LEFT HEART CATH AND CORONARY ANGIOGRAPHY
Anesthesia: LOCAL

## 2016-09-02 MED ORDER — ONDANSETRON HCL 4 MG/2ML IJ SOLN: 4.0000 mg | Freq: Four times a day (QID) | INTRAMUSCULAR | Status: DC | PRN

## 2016-09-02 MED ORDER — HEPARIN SODIUM (PORCINE) 1000 UNIT/ML IJ SOLN
INTRAMUSCULAR | Status: AC
  Filled 2016-09-02: qty 1

## 2016-09-02 MED ORDER — HEPARIN BOLUS VIA INFUSION
4000.0000 [IU] | Freq: Once | INTRAVENOUS | Status: AC
  Administered 2016-09-02: 4000 [IU] via INTRAVENOUS

## 2016-09-02 MED ORDER — MORPHINE SULFATE (PF) 4 MG/ML IV SOLN
4.0000 mg | Freq: Once | INTRAVENOUS | Status: AC
  Administered 2016-09-02: 4 mg via INTRAVENOUS
  Filled 2016-09-02: qty 1

## 2016-09-02 MED ORDER — IOPAMIDOL (ISOVUE-370) INJECTION 76%
INTRAVENOUS | Status: DC | PRN
  Administered 2016-09-02: 75 mL via INTRAVENOUS

## 2016-09-02 MED ORDER — IOPAMIDOL (ISOVUE-370) INJECTION 76%
INTRAVENOUS | Status: AC
  Filled 2016-09-02: qty 100

## 2016-09-02 MED ORDER — SODIUM CHLORIDE 0.9 % IV SOLN
INTRAVENOUS | Status: DC | PRN
  Administered 2016-09-02: 87 mL via INTRAVENOUS

## 2016-09-02 MED ORDER — ASPIRIN 81 MG PO CHEW: 81.0000 mg | CHEWABLE_TABLET | Freq: Every day | ORAL | Status: DC

## 2016-09-02 MED ORDER — FENTANYL CITRATE (PF) 100 MCG/2ML IJ SOLN
INTRAMUSCULAR | Status: AC
  Filled 2016-09-02: qty 2

## 2016-09-02 MED ORDER — SODIUM CHLORIDE 0.9 % IV SOLN: 250.0000 mL | INTRAVENOUS | Status: DC | PRN

## 2016-09-02 MED ORDER — ACETAMINOPHEN 325 MG PO TABS: 650.0000 mg | ORAL_TABLET | ORAL | Status: DC | PRN

## 2016-09-02 MED ORDER — MORPHINE SULFATE (PF) 2 MG/ML IV SOLN: 1.0000 mg | INTRAVENOUS | Status: DC | PRN

## 2016-09-02 MED ORDER — HEPARIN (PORCINE) IN NACL 2-0.9 UNIT/ML-% IJ SOLN
INTRAMUSCULAR | Status: AC
  Filled 2016-09-02: qty 1000

## 2016-09-02 MED ORDER — HEPARIN SODIUM (PORCINE) 1000 UNIT/ML IJ SOLN
INTRAMUSCULAR | Status: DC | PRN
  Administered 2016-09-02: 4500 [IU] via INTRAVENOUS

## 2016-09-02 MED ORDER — FENTANYL CITRATE (PF) 100 MCG/2ML IJ SOLN
INTRAMUSCULAR | Status: DC | PRN
  Administered 2016-09-02: 25 ug via INTRAVENOUS

## 2016-09-02 MED ORDER — NITROGLYCERIN IN D5W 200-5 MCG/ML-% IV SOLN
0.0000 ug/min | Freq: Once | INTRAVENOUS | Status: AC
  Administered 2016-09-02: 5 ug/min via INTRAVENOUS
  Filled 2016-09-02: qty 250

## 2016-09-02 MED ORDER — LIDOCAINE HCL (PF) 1 % IJ SOLN
INTRAMUSCULAR | Status: DC | PRN
  Administered 2016-09-02: 2 mL

## 2016-09-02 MED ORDER — SODIUM CHLORIDE 0.9 % IV SOLN: INTRAVENOUS | Status: AC

## 2016-09-02 MED ORDER — ALPRAZOLAM 0.25 MG PO TABS: 0.2500 mg | ORAL_TABLET | Freq: Two times a day (BID) | ORAL | Status: DC | PRN

## 2016-09-02 MED ORDER — VERAPAMIL HCL 2.5 MG/ML IV SOLN
INTRAVENOUS | Status: DC | PRN
  Administered 2016-09-02: 10 mL via INTRA_ARTERIAL

## 2016-09-02 MED ORDER — SODIUM CHLORIDE 0.9% FLUSH: 3.0000 mL | INTRAVENOUS | Status: DC | PRN

## 2016-09-02 MED ORDER — ASPIRIN 81 MG PO CHEW
324.0000 mg | CHEWABLE_TABLET | Freq: Once | ORAL | Status: AC
  Administered 2016-09-02: 324 mg via ORAL
  Filled 2016-09-02: qty 4

## 2016-09-02 MED ORDER — HEPARIN (PORCINE) IN NACL 2-0.9 UNIT/ML-% IJ SOLN
INTRAMUSCULAR | Status: DC | PRN
Start: 2016-09-02 — End: 2016-09-02
  Administered 2016-09-02: 1000 mL

## 2016-09-02 MED ORDER — ASPIRIN 81 MG PO CHEW
81.0000 mg | CHEWABLE_TABLET | ORAL | Status: DC
Start: 2016-09-03 — End: 2016-09-03

## 2016-09-02 MED ORDER — METOPROLOL TARTRATE 25 MG PO TABS
25.0000 mg | ORAL_TABLET | Freq: Every day | ORAL | Status: DC
  Administered 2016-09-02: 20:00:00 25 mg via ORAL
  Filled 2016-09-02: qty 1

## 2016-09-02 MED ORDER — SODIUM CHLORIDE 0.9 % IV SOLN: INTRAVENOUS | Status: DC

## 2016-09-02 MED ORDER — ATORVASTATIN CALCIUM 10 MG PO TABS
10.0000 mg | ORAL_TABLET | ORAL | Status: DC
  Filled 2016-09-02: qty 1

## 2016-09-02 MED ORDER — LIDOCAINE HCL (PF) 1 % IJ SOLN
INTRAMUSCULAR | Status: AC
  Filled 2016-09-02: qty 30

## 2016-09-02 MED ORDER — AMLODIPINE BESYLATE 5 MG PO TABS
2.5000 mg | ORAL_TABLET | Freq: Every day | ORAL | Status: DC
  Administered 2016-09-02: 2.5 mg via ORAL
  Filled 2016-09-02 (×2): qty 1

## 2016-09-02 MED ORDER — SODIUM CHLORIDE 0.9% FLUSH
3.0000 mL | Freq: Two times a day (BID) | INTRAVENOUS | Status: DC
  Administered 2016-09-03: 3 mL via INTRAVENOUS

## 2016-09-02 MED ORDER — IRBESARTAN 150 MG PO TABS
150.0000 mg | ORAL_TABLET | Freq: Every day | ORAL | Status: DC
  Administered 2016-09-03: 09:00:00 150 mg via ORAL
  Filled 2016-09-02: qty 1
  Filled 2016-09-02: qty 2

## 2016-09-02 MED ORDER — MIDAZOLAM HCL 2 MG/2ML IJ SOLN
INTRAMUSCULAR | Status: AC
  Filled 2016-09-02: qty 2

## 2016-09-02 MED ORDER — NITROGLYCERIN 2 % TD OINT
1.0000 [in_us] | TOPICAL_OINTMENT | Freq: Four times a day (QID) | TRANSDERMAL | Status: DC
  Filled 2016-09-02: qty 1

## 2016-09-02 MED ORDER — HEPARIN (PORCINE) IN NACL 100-0.45 UNIT/ML-% IJ SOLN
1000.0000 [IU]/h | INTRAMUSCULAR | Status: DC
  Administered 2016-09-02: 1000 [IU]/h via INTRAVENOUS
  Filled 2016-09-02: qty 250

## 2016-09-02 MED ORDER — MIDAZOLAM HCL 2 MG/2ML IJ SOLN
INTRAMUSCULAR | Status: DC | PRN
  Administered 2016-09-02: 2 mg via INTRAVENOUS

## 2016-09-02 MED ORDER — SODIUM CHLORIDE 0.9% FLUSH
3.0000 mL | Freq: Two times a day (BID) | INTRAVENOUS | Status: DC
  Administered 2016-09-03: 09:00:00 3 mL via INTRAVENOUS

## 2016-09-02 MED ORDER — VERAPAMIL HCL 2.5 MG/ML IV SOLN
INTRAVENOUS | Status: AC
  Filled 2016-09-02: qty 2

## 2016-09-02 MED ORDER — NITROGLYCERIN 0.4 MG SL SUBL: 0.4000 mg | SUBLINGUAL_TABLET | SUBLINGUAL | Status: DC | PRN

## 2016-09-02 MED ORDER — ASPIRIN EC 81 MG PO TBEC
81.0000 mg | DELAYED_RELEASE_TABLET | Freq: Every day | ORAL | Status: DC
  Administered 2016-09-03: 09:00:00 81 mg via ORAL
  Filled 2016-09-02: qty 1

## 2016-09-02 SURGICAL SUPPLY — 11 items
CATH EXPO 5FR ANG PIGTAIL 145 (CATHETERS) ×2 IMPLANT
CATH OPTITORQUE TIG 4.0 5F (CATHETERS) ×2 IMPLANT
DEVICE RAD COMP TR BAND LRG (VASCULAR PRODUCTS) ×2 IMPLANT
GLIDESHEATH SLEND A-KIT 6F 22G (SHEATH) ×2 IMPLANT
GUIDEWIRE INQWIRE 1.5J.035X260 (WIRE) ×1 IMPLANT
INQWIRE 1.5J .035X260CM (WIRE) ×2
KIT HEART LEFT (KITS) ×2 IMPLANT
PACK CARDIAC CATHETERIZATION (CUSTOM PROCEDURE TRAY) ×2 IMPLANT
SYR MEDRAD MARK V 150ML (SYRINGE) ×2 IMPLANT
TRANSDUCER W/STOPCOCK (MISCELLANEOUS) ×2 IMPLANT
TUBING CIL FLEX 10 FLL-RA (TUBING) ×2 IMPLANT

## 2016-09-02 NOTE — ED Notes (Signed)
Attempted report x1. 

## 2016-09-02 NOTE — ED Triage Notes (Signed)
Pt reports pain in left chest without radiation x5 days, uncontrolled belching, lightheadedness, back pain, nausea.  Pt alert and oriented at this time.  Pt also mentions recent stress of losing job.

## 2016-09-02 NOTE — Progress Notes (Signed)
ANTICOAGULATION CONSULT NOTE - Initial Consult  Pharmacy Consult for heparin Indication: chest pain/ACS  Allergies  Allergen Reactions  . Mango Flavor Anaphylaxis    Patient Measurements: Height: 5\' 5"  (165.1 cm) Weight: 192 lb (87.1 kg) IBW/kg (Calculated) : 57  Vital Signs: Temp: 97.9 F (36.6 C) (03/19 1145) Temp Source: Oral (03/19 1145) BP: 144/89 (03/19 1227) Pulse Rate: 83 (03/19 1227)  Labs:  Recent Labs  09/02/16 1150  HGB 13.7  HCT 41.2  PLT 326  CREATININE 0.83  TROPONINI <0.03    Estimated Creatinine Clearance: 70.7 mL/min (by C-G formula based on SCr of 0.83 mg/dL).   Medical History: Past Medical History:  Diagnosis Date  . Arthritis   . Cancer ([place])   . Diverticulosis   . GERD (gastroesophageal reflux disease)   . Hiatal hernia   . History of chicken pox   . Hyperlipidemia   . Hypertension   . Mononucleosis    2/15  . MVP (mitral valve prolapse)   . Osteopenia 04/30/2015    Assessment: 12 yp female to start on heparin for chest pain. No known a/c PTA and CBC stable.   Goal of Therapy:  Heparin level 0.3-0.7 units/ml Monitor platelets by anticoagulation protocol: Yes   Plan:  Give 4000 units bolus x 1 Start heparin infusion at 1000 units/hr Check anti-Xa level in 8 hours and daily while on heparin Continue to monitor H&H and platelets   Vincenza Hews, PharmD, BCPS 09/02/2016, 12:41 PM Phone # [phone]

## 2016-09-02 NOTE — ED Notes (Signed)
Confirmed with Jen in [place] that NTG and Heparin are compatible to infuse together.

## 2016-09-02 NOTE — ED Notes (Signed)
ekg given to Dr. Dorothyann Gibbs.

## 2016-09-02 NOTE — H&P (Signed)
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 second metoprolol  As BP was a little high at 182/90s  BP came down  Pt says sh then got N/ and heart burn  Has had GERD in past  THis heart burn sensation was different   Episodes the past week have occurred with and without activity  Has noticed a back ache but attributated to hauling some things at school    Mowed lawn a couple days ago without problem   Last night light headed   Today she has just felt bad all day  Achy ALl day has had achiness and some discmofrt in abdomen and neck   Got light headed and a little short of breath pushing cart  Still not normal  On heparin and NTG         Past Medical History:  Diagnosis Date  . Arthritis   . Cancer ([place])   . Diverticulosis   . GERD (gastroesophageal reflux disease)   . Hiatal hernia   . History of chicken pox   . Hyperlipidemia   . Hypertension   . Mononucleosis    2/15  . MVP (mitral valve prolapse)   . Osteopenia 04/30/2015    Medications Prior to Admission  Medication Sig Dispense Refill  . aspirin 81 MG chewable tablet Chew 81 mg by mouth. 3 times a week    . atorvastatin (LIPITOR) 10 MG tablet Take 1 tablet (10 mg total) by mouth every other day. 90 tablet 3  . metoprolol tartrate (LOPRESSOR) 25 MG tablet Take 1 tablet (25 mg total) by mouth at bedtime. 90 tablet 3  . NON FORMULARY Take 1 tablet by mouth daily. IB Guard for (IBS) peppermint    . Omega-3 Fatty Acids (FISH OIL) 1000 MG CAPS Take 1 capsule (1,000 mg total) by mouth 2 (two) times daily. 180 capsule 3  . valsartan (DIOVAN) 160 MG tablet Take 1 tablet (160 mg total) by mouth daily. 90 tablet 3       Infusions: . heparin  1,000 Units/hr (09/02/16 1301)    Allergies  Allergen Reactions  . Mango Flavor Anaphylaxis    Social History   Social History  . Marital status: Single    Spouse name: N/A  . Number of children: 4  . Years of education: N/A   Occupational History  . teacher    Social History Main Topics  . Smoking status: Never Smoker  . Smokeless tobacco: Never Used  . Alcohol use 0.0 oz/week     Comment: Rarely  . Drug use: No  . Sexual activity: Not on file   Other Topics Concern  . Not on file   Social History Narrative   Recently moved to be near her daughter Mosetta Putt.     Has 4 children   Teaches latin Youth worker) Technology, STEM (middle school) Writer at [employer]   Completed masters degree   Divorced     Family History  Problem Relation Age of Onset  . Hypertension Mother   . Arthritis Mother   . Colon polyps Mother     had  2 surgeries  . Prostate cancer Father   . Arthritis Maternal Grandmother   . Diabetes Cousin     maternal  . Arthritis Sister   . Ovarian cancer Sister     ovarian and breast  . Breast cancer Sister     REVIEW OF SYSTEMS:  All systems reviewed  Negative to the above problem except as noted above.    PHYSICAL EXAM: Vitals:   09/02/16 1421 09/02/16 1425  BP: (!) 147/82 (!) 147/82  Pulse: 89 84  Resp: (!) 24 15  Temp:  98.1 F (36.7 C)    No intake or output data in the 24 hours ending 09/02/16 1550  General:  Well appearing. No respiratory difficulty HEENT: normal Neck: supple. no JVD. Carotids 2+ bilat; no bruits. No lymphadenopathy or thryomegaly appreciated. Cor: PMI nondisplaced. Regular rate & rhythm. No rubs, gallops or murmurs. Lungs: clear Abdomen: soft, nontender, nondistended. No hepatosplenomegaly. No bruits or masses. Good bowel sounds. Extremities: no cyanosis, clubbing, rash, edema Neuro: alert & oriented x 3, cranial nerves grossly intact. moves all 4 extremities w/o difficulty. Affect  pleasant.  ECG:  SR  T wave inversion II, III< AVF  New    Results for orders placed or performed during the hospital encounter of 09/02/16 (from the past 24 hour(s))  Basic metabolic panel     Status: Abnormal   Collection Time: 09/02/16 11:50 AM  Result Value Ref Range   Sodium 139 135 - 145 mmol/L   Potassium 4.0 3.5 - 5.1 mmol/L   Chloride 105 101 - 111 mmol/L   CO2 24 22 - 32 mmol/L   Glucose, Bld 100 (H) 65 - 99 mg/dL   BUN 12 6 - 20 mg/dL   Creatinine, Ser 0.83 0.44 - 1.00 mg/dL   Calcium 9.7 8.9 - 10.3 mg/dL   GFR calc non Af Amer >60 >60 mL/min   GFR calc Af Amer >60 >60 mL/min   Anion gap 10 5 - 15  CBC     Status: None   Collection Time: 09/02/16 11:50 AM  Result Value Ref Range   WBC 10.5 4.0 - 10.5 K/uL   RBC 4.68 3.87 - 5.11 MIL/uL   Hemoglobin 13.7 12.0 - 15.0 g/dL   HCT 41.2 36.0 - 46.0 %   MCV 88.0 78.0 - 100.0 fL   MCH 29.3 26.0 - 34.0 pg   MCHC 33.3 30.0 - 36.0 g/dL   RDW 13.7 11.5 - 15.5 %   Platelets 326 150 - 400 K/uL  Troponin I     Status: None   Collection Time: 09/02/16 11:50 AM  Result Value Ref Range   Troponin I <0.03 <0.03 ng/mL  Hepatic function panel     Status: None   Collection Time: 09/02/16 11:50 AM  Result Value Ref Range   Total Protein 7.7 6.5 - 8.1 g/dL   Albumin 4.2 3.5 - 5.0 g/dL   AST 29 15 - 41 U/L   ALT 31 14 - 54 U/L   Alkaline Phosphatase 89 38 - 126 U/L   Total Bilirubin 0.5 0.3 - 1.2 mg/dL   Bilirubin, Direct 0.1 0.1 - 0.5 mg/dL   Indirect Bilirubin 0.4 0.3 - 0.9 mg/dL  Lipase, blood     Status: None   Collection Time: 09/02/16 11:50 AM  Result Value Ref Range   Lipase 23 11 - 51 U/L   Dg Chest 2 View  Result Date: 09/02/2016 CLINICAL DATA:  Left-sided chest pain. EXAM: CHEST  2  VIEW COMPARISON:  None. FINDINGS: The heart size and mediastinal contours are within normal limits. Both lungs are clear. The visualized skeletal structures are unremarkable. IMPRESSION: Normal chest. Electronically Signed   By: Lorriane Shire M.D.   On: 09/02/2016 12:03     ASSESSMENT:  Pt is a 71 yo with no known CAD   5 day history of intermittent chest discomfort  Worse yesterday and today  EKG with new T wave inversion inferiorly  Prominent  Troponin neg x 1.  Hx concerning  She is still with vague discomfort (abdomen, reflux sensation)   I reviewed with pt  I would recomm L heart cath with poss angioplasty  Risks / benefits described  Pt understands and agrees to proceed.    2.  HTN  Will follow BP  3  HL  Last lipid LDL 84, HDL 55  (October 2016)  Will repeat in AM  Continue statin    4/  ? MVP  Normal echo (outside report) in past  No click or murmur on exam

## 2016-09-02 NOTE — ED Provider Notes (Signed)
[facility] DEPT [facility] Provider Note   CSN: [id number] Arrival date & time: 09/02/16  1136     History   Chief Complaint Chief Complaint  Patient presents with  . Chest Pain    HPI Debra Burgess is a 71 y.o. female.  HPI Patient states she started developing chest discomfort 4 days ago that felt like heartburn. She started getting combination of heartburn sensation and pressure sensation in her chest. She started taking some antacid medication which helped relieve the burning component of her chest discomfort but she still had some pressure quality. She reports that was tolerable. The patient reports that over the weekend she started developing severe fatigue as well. She also began developing shortness of breath and lightheadedness. 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 At this time she determined she should seek treatment. She reports she does still have some pressure discomfort quality in her chest. No lower extremity swelling or calf pain. No Prior history of heart attack. She does reports she's had mitral valve prolapse. No valve replacements. She had taken aspirin last night but not today. Past Medical History:  Diagnosis Date  . Arthritis   . Cancer ([place])   . Diverticulosis   . GERD (gastroesophageal reflux disease)   . Hiatal hernia   . History of chicken pox   . Hyperlipidemia   . Hypertension   . Mononucleosis    2/15  . MVP (mitral valve prolapse)   . Osteopenia 04/30/2015    Patient Active Problem List   Diagnosis Date Noted  . NSTEMI (non-ST elevated myocardial infarction) ([place]) 09/02/2016  . Nonspecific abnormal finding in stool contents 11/28/2015  . Change in bowel habits 11/28/2015  . Gas 11/28/2015  . Bloating 11/28/2015  . Osteopenia 04/30/2015  . Palpitations 04/10/2015  . Hyperlipidemia 03/16/2015  . GERD (gastroesophageal reflux disease) 03/16/2015  . Mitral  valve regurgitation 03/16/2015  . HTN (hypertension) 03/16/2015    Past Surgical History:  Procedure Laterality Date  . BREAST SURGERY Bilateral 2010   breast reduction  . HIATAL HERNIA REPAIR  2015   "severe hiatal hernia repair", robotic repair with pig skin sheath  . TONSILLECTOMY AND ADENOIDECTOMY  1952    OB History    No data available       Home Medications    Prior to Admission medications   Medication Sig Start Date End Date Taking? Authorizing Provider  aspirin 81 MG chewable tablet Chew 81 mg by mouth. 3 times a week    Historical Provider, MD  atorvastatin (LIPITOR) 10 MG tablet Take 1 tablet (10 mg total) by mouth every other day. 01/10/16   Burtis Junes, NP  metoprolol tartrate (LOPRESSOR) 25 MG tablet Take 1 tablet (25 mg total) by mouth at bedtime. 01/10/16   Burtis Junes, NP  NON FORMULARY Take 1 tablet by mouth daily. IB Guard for (IBS) peppermint    Historical Provider, MD  Omega-3 Fatty Acids (FISH OIL) 1000 MG CAPS Take 1 capsule (1,000 mg total) by mouth 2 (two) times daily. 04/10/15   Lelon Perla, MD  valsartan (DIOVAN) 160 MG tablet Take 1 tablet (160 mg total) by mouth daily. 01/10/16   Burtis Junes, NP    Family History Family History  Problem Relation Age of Onset  . Hypertension Mother   . Arthritis Mother   . Colon polyps Mother     had 2 surgeries  .  Prostate cancer Father   . Arthritis Maternal Grandmother   . Diabetes Cousin     maternal  . Arthritis Sister   . Ovarian cancer Sister     ovarian and breast  . Breast cancer Sister     Social History Social History  Substance Use Topics  . Smoking status: Never Smoker  . Smokeless tobacco: Never Used  . Alcohol use 0.0 oz/week     Comment: Rarely     Allergies   Mango flavor   Review of Systems Review of Systems 10 Systems reviewed and are negative for acute change except as noted in the HPI.  Physical Exam Updated Vital Signs BP (!) 153/81   Pulse 79   Temp  97.9 F (36.6 C) (Oral)   Resp 10   Ht 5\' 5"  (1.651 m)   Wt 192 lb (87.1 kg)   LMP 10/15/1997   SpO2 98%   BMI 31.95 kg/m   Physical Exam  Constitutional: She is oriented to person, place, and time. She appears well-developed and well-nourished. No distress.  HENT:  Head: Normocephalic and atraumatic.  Mouth/Throat: Oropharynx is clear and moist.  Eyes: Conjunctivae and EOM are normal.  Neck: Neck supple.  Cardiovascular: Normal rate, regular rhythm, normal heart sounds and intact distal pulses.   No murmur heard. Pulmonary/Chest: Effort normal and breath sounds normal. No respiratory distress.  Abdominal: Soft. She exhibits no distension. There is no tenderness. There is no guarding.  Musculoskeletal: She exhibits no edema or tenderness.  Neurological: She is alert and oriented to person, place, and time. No cranial nerve deficit. She exhibits normal muscle tone. Coordination normal.  Skin: Skin is warm and dry.  Psychiatric: She has a normal mood and affect.  Nursing note and vitals reviewed.    ED Treatments / Results  Labs (all labs ordered are listed, but only abnormal results are displayed) Labs Reviewed  BASIC METABOLIC PANEL - Abnormal; Notable for the following:       Result Value   Glucose, Bld 100 (*)    All other components within normal limits  CBC  TROPONIN I  HEPATIC FUNCTION PANEL  LIPASE, BLOOD  HEPARIN LEVEL (UNFRACTIONATED)    EKG  EKG Interpretation  Date/Time:  Monday September 02 2016 11:47:30 EDT Ventricular Rate:  92 PR Interval:    QRS Duration: 78 QT Interval:  349 QTC Calculation: 432 R Axis:     Text Interpretation:  EASI inferior ischemia no old comparison Confirmed by Johnney Killian, MD, Jeannie Done [phone]) on 09/02/2016 11:51:37 AM Also confirmed by Johnney Killian, MD, [place] [phone]), editor [place], Leda Gauze [phone])  on 09/02/2016 11:52:50 AM       Radiology Dg Chest 2 View  Result Date: 09/02/2016 CLINICAL DATA:  Left-sided chest pain. EXAM: CHEST   2 VIEW COMPARISON:  None. FINDINGS: The heart size and mediastinal contours are within normal limits. Both lungs are clear. The visualized skeletal structures are unremarkable. IMPRESSION: Normal chest. Electronically Signed   By: Lorriane Shire M.D.   On: 09/02/2016 12:03    Procedures Procedures (including critical care time) CRITICAL CARE Performed by: Charlesetta Shanks   Total critical care time: 30 minutes  Critical care time was exclusive of separately billable procedures and treating other patients.  Critical care was necessary to treat or prevent imminent or life-threatening deterioration.  Critical care was time spent personally by me on the following activities: development of treatment plan with patient and/or surrogate as well as nursing, discussions with consultants, evaluation of  patient's response to treatment, examination of patient, obtaining history from patient or surrogate, ordering and performing treatments and interventions, ordering and review of laboratory studies, ordering and review of radiographic studies, pulse oximetry and re-evaluation of patient's condition. Medications Ordered in ED Medications  heparin ADULT infusion 100 units/mL (25000 units/230mL sodium chloride 0.45%) (1,000 Units/hr Intravenous New Bag/Given 09/02/16 1301)  aspirin chewable tablet 324 mg (324 mg Oral Given 09/02/16 1228)  nitroGLYCERIN 50 mg in dextrose 5 % 250 mL (0.2 mg/mL) infusion (5 mcg/min Intravenous New Bag/Given 09/02/16 1257)  morphine 4 MG/ML injection 4 mg (4 mg Intravenous Given 09/02/16 1251)  heparin bolus via infusion 4,000 Units (4,000 Units Intravenous Bolus from Bag 09/02/16 1303)     Initial Impression / Assessment and Plan / ED Course  I have reviewed the triage vital signs and the nursing notes.  Pertinent labs & imaging results that were available during my care of the patient were reviewed by me and considered in my medical decision making (see chart for  details).    Consult: Dr. Harrington Challenger cardiology. Heparin, NTG, Morphine transfer for admission to SD and plan catheterization.  Final Clinical Impressions(s) / ED Diagnoses   Final diagnoses:  NSTEMI (non-ST elevated myocardial infarction) [facility])  Patient is a waxing waning symptoms for 4 days. Patient is alert and appropriate. She does not have respiratory distress. EKG has ischemic appearance. Consultation to cardiology made. Patient will be treated with heparin and nitroglycerin and morphine. After initiating treatment patient felt much improved. Will be transferred to [facility] for definitive management.  New Prescriptions New Prescriptions   No medications on file     Charlesetta Shanks, MD 09/13/16 [phone]

## 2016-09-02 NOTE — ED Notes (Signed)
Report to [place], Therapist, sports at [facility]

## 2016-09-02 NOTE — Care Management Note (Addendum)
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                  Action/Plan:   Expected Discharge Date:                  Expected Discharge Plan:  Home/Self Care  In-House Referral:     Discharge planning Services  CM Consult  Post Acute Care Choice:    Choice offered to:     DME Arranged:    DME Agency:     HH Arranged:    HH Agency:     Status of Service:  In process, will continue to follow  If discussed at Long Length of Stay Meetings, dates discussed:    Additional Comments:  Zenon Mayo, RN 09/02/2016, 6:14 PM

## 2016-09-02 NOTE — Telephone Encounter (Signed)
Called patient to schedule AWV but patient stated on answering that she has been experiencing intermittent chest pain x5 days that is increasing in frequency. She describes the pain as a "burning" sensation in her L upper chest. The pain is worse at night, but also occurs during the day. Sometimes coughing improves the pain. She questions if this might be heartburn/indigestion, but states it feels "deeper" than her normal indigestion. She recently lost her job and is under a great deal of stress. She also endorses DOE. Denies any shoulder/arm pain. Advised patient to go to ED now for further evaluation. She initially stated that she would go after work today, but then did agree to leave work and go to ED now.

## 2016-09-02 NOTE — Telephone Encounter (Signed)
Noted and agree. 

## 2016-09-02 NOTE — Progress Notes (Signed)
TR BAND REMOVAL  LOCATION:    right radial  DEFLATED PER PROTOCOL:    Yes.    TIME BAND OFF / DRESSING APPLIED:    20:30   SITE UPON ARRIVAL:    Level 0  SITE AFTER BAND REMOVAL:    Level 0  CIRCULATION SENSATION AND MOVEMENT:    Within Normal Limits   Yes.    COMMENTS:   Post TR band instructions given. Pt tolerated well. 

## 2016-09-02 NOTE — ED Notes (Signed)
Pt. returned from XR. 

## 2016-09-02 NOTE — Interval H&P Note (Signed)
History and Physical Interval Note:  5/32/0233 4:35 PM  Debra Burgess  has presented today for surgery, with the diagnosis of cp  The various methods of treatment have been discussed with the patient and family. After consideration of risks, benefits and other options for treatment, the patient has consented to  Procedure(s): Left Heart Cath and Coronary Angiography (N/A) with possible Percutaneous Coronary Intervention as a surgical interventio .  The patient's history has been reviewed, patient examined, no change in status, stable for surgery.  I have reviewed the patient's chart and labs.  Questions were answered to the patient's satisfaction.    Cath Lab Visit (complete for each Cath Lab visit)  Clinical Evaluation Leading to the Procedure:   ACS: Yes.    Non-ACS:    Anginal Classification: CCS IV  Anti-ischemic medical therapy: Minimal Therapy (1 class of medications)  Non-Invasive Test Results: No non-invasive testing performed  Prior CABG: No previous CABG   Debra Burgess

## 2016-09-03 ENCOUNTER — Encounter (HOSPITAL_COMMUNITY): Payer: Self-pay | Admitting: General Practice

## 2016-09-03 DIAGNOSIS — R072 Precordial pain: Secondary | ICD-10-CM

## 2016-09-03 LAB — LIPID PANEL
CHOL/HDL RATIO: 3.6 ratio
CHOLESTEROL: 169 mg/dL (ref 0–200)
HDL: 47 mg/dL (ref >40)
LDL Cholesterol: 89 mg/dL (ref 0–99)
Triglycerides: 167 mg/dL — ABNORMAL HIGH (ref <150)
VLDL: 33 mg/dL (ref 0–40)

## 2016-09-03 LAB — CBC
HCT: 38.5 % (ref 36.0–46.0)
Hemoglobin: 12.2 g/dL (ref 12.0–15.0)
MCH: 28.3 pg (ref 26.0–34.0)
MCHC: 31.7 g/dL (ref 30.0–36.0)
MCV: 89.3 fL (ref 78.0–100.0)
PLATELETS: 292 10*3/uL (ref 150–400)
RBC: 4.31 MIL/uL (ref 3.87–5.11)
RDW: 13.6 % (ref 11.5–15.5)
WBC: 10.3 10*3/uL (ref 4.0–10.5)

## 2016-09-03 LAB — BASIC METABOLIC PANEL
Anion gap: 8 (ref 5–15)
BUN: 11 mg/dL (ref 6–20)
CALCIUM: 9.1 mg/dL (ref 8.9–10.3)
CO2: 25 mmol/L (ref 22–32)
CREATININE: 0.86 mg/dL (ref 0.44–1.00)
Chloride: 106 mmol/L (ref 101–111)
GFR calc Af Amer: >60 mL/min (ref >60)
GFR calc non Af Amer: >60 mL/min (ref >60)
Glucose, Bld: 96 mg/dL (ref 65–99)
Potassium: 4.2 mmol/L (ref 3.5–5.1)
SODIUM: 139 mmol/L (ref 135–145)

## 2016-09-03 LAB — TROPONIN I: Troponin I: 0.03 ng/mL (ref <0.03)

## 2016-09-03 MED ORDER — NITROGLYCERIN 0.4 MG SL SUBL: 0.4000 mg | SUBLINGUAL_TABLET | SUBLINGUAL | 2 refills | Status: DC | PRN

## 2016-09-03 MED ORDER — METOPROLOL TARTRATE 25 MG PO TABS: 25.0000 mg | ORAL_TABLET | Freq: Two times a day (BID) | ORAL | 3 refills | Status: DC

## 2016-09-03 MED ORDER — ROSUVASTATIN CALCIUM 10 MG PO TABS
5.0000 mg | ORAL_TABLET | Freq: Every day | ORAL | Status: DC
  Administered 2016-09-03: 09:00:00 5 mg via ORAL
  Filled 2016-09-03: qty 1

## 2016-09-03 MED ORDER — METOPROLOL TARTRATE 25 MG PO TABS: 25.0000 mg | ORAL_TABLET | Freq: Every day | ORAL | 3 refills | Status: DC

## 2016-09-03 MED ORDER — ROSUVASTATIN CALCIUM 5 MG PO TABS: 5.0000 mg | ORAL_TABLET | Freq: Every day | ORAL | 5 refills | Status: DC

## 2016-09-03 NOTE — Consult Note (Signed)
           [facility] Primary Care Navigator  09/25/4641  Debra Burgess [birth year] [id number]   Wentto see patient today at the bedside to identify possible discharge needs but staffreports that she was alreadydischarged.  Patient was discharged home today. Primary care provider's office called (Tiffany)to notify of patient's discharge and possible need for post hospital follow-up and transition of care.  Made aware to refer patient to [facility] care management if deemed appropriate for services.   For questions, please contact:  Dannielle Huh, BSN, RN- [facility] Primary Care Navigator  Telephone: [phone] [place]

## 2016-09-03 NOTE — Progress Notes (Signed)
Progress Note  Patient Name: Debra Burgess Date of Encounter: 09/03/2016  Primary Cardiologist: Dr Stanford Breed  Subjective   Vague chest pain overnight- may last "a minute"  Inpatient Medications    Scheduled Meds: . sodium chloride   Intravenous STAT  . amLODipine  2.5 mg Oral Daily  . aspirin  81 mg Oral Pre-Cath  . [START ON 09/04/2016] aspirin  81 mg Oral Daily  . aspirin EC  81 mg Oral Daily  . atorvastatin  10 mg Oral QODAY  . irbesartan  150 mg Oral Daily  . metoprolol tartrate  25 mg Oral QHS  . sodium chloride flush  3 mL Intravenous Q12H  . sodium chloride flush  3 mL Intravenous Q12H  . sodium chloride flush  3 mL Intravenous Q12H   Continuous Infusions: . sodium chloride     PRN Meds: sodium chloride, sodium chloride, sodium chloride, acetaminophen, ALPRAZolam, morphine injection, nitroGLYCERIN, sodium chloride flush, sodium chloride flush, sodium chloride flush   Vital Signs    Vitals:   09/02/16 2000 09/02/16 2026 09/02/16 2100 09/03/16 0643  BP:  125/65 107/71 130/68  Pulse: 85 80 64 79  Resp: 17 15 12 15   Temp:  97.4 F (36.3 C)  97.7 F (36.5 C)  TempSrc:  Oral  Oral  SpO2: 97% 93% 95% 98%  Weight:    191 lb 9.3 oz (86.9 kg)  Height:        Intake/Output Summary (Last 24 hours) at 09/03/16 0821 Last data filed at 09/02/16 2300  Gross per 24 hour  Intake             1045 ml  Output                0 ml  Net             1045 ml   Filed Weights   09/02/16 1144 09/03/16 0643  Weight: 192 lb (87.1 kg) 191 lb 9.3 oz (86.9 kg)    Telemetry    NSR - Personally Reviewed  ECG    09/03/16- NSR, poor anterior RW, TWI resolved - Personally Reviewed  09/02/16- NSR marked TWI inferior leads  Physical Exam   GEN: No acute distress.   Neck: No JVD Cardiac: RRR, no murmurs, rubs, or gallops.  Respiratory: Clear to auscultation bilaterally. GI: Soft, nontender, non-distended  MS: No edema; No deformity. Neuro:  Nonfocal  Psych: Normal affect     Labs    Chemistry Recent Labs Lab 09/02/16 1150 09/03/16 0210  NA 139 139  K 4.0 4.2  CL 105 106  CO2 24 25  GLUCOSE 100* 96  BUN 12 11  CREATININE 0.83 0.86  CALCIUM 9.7 9.1  PROT 7.7  --   ALBUMIN 4.2  --   AST 29  --   ALT 31  --   ALKPHOS 89  --   BILITOT 0.5  --   GFRNONAA >60 >60  GFRAA >60 >60  ANIONGAP 10 8     Hematology Recent Labs Lab 09/02/16 1150 09/03/16 0210  WBC 10.5 10.3  RBC 4.68 4.31  HGB 13.7 12.2  HCT 41.2 38.5  MCV 88.0 89.3  MCH 29.3 28.3  MCHC 33.3 31.7  RDW 13.7 13.6  PLT 326 292    Cardiac Enzymes Recent Labs Lab 09/02/16 1150  TROPONINI <0.03   No results for input(s): TROPIPOC in the last 168 hours.   BNPNo results for input(s): BNP, PROBNP in the last 168 hours.  DDimer  Recent Labs Lab 09/02/16 1852  DDIMER <0.27     Radiology    Dg Chest 2 View  Result Date: 09/02/2016 CLINICAL DATA:  Left-sided chest pain. EXAM: CHEST  2 VIEW COMPARISON:  None. FINDINGS: The heart size and mediastinal contours are within normal limits. Both lungs are clear. The visualized skeletal structures are unremarkable. IMPRESSION: Normal chest. Electronically Signed   By: Lorriane Shire M.D.   On: 09/02/2016 12:03    Cardiac Studies   Cath 09/02/16-  Prox LAD lesion, 35 %stenosed. Non-flow-limiting  Mid Cx lesion, 45 %stenosed. Potential site of spasm  Lat Ramus branch lesion, 60 %stenosed. Small-caliber vessel very tortuous. Not PCI target  There is hyperdynamic left ventricular systolic function.  The left ventricular ejection fraction is greater than 65% by visual estimate.  LV end diastolic pressure is normal.  Patient Profile     71 y.o. female admitted 09/02/16 with chest pain. She had a Myoview in July 2017- negative. Her EKG on 09/02/16 showed significant, new, inferior TWI. This resolved on her 3/20 EKG. Troponin were negative x1. Cath done 09/02/16 as above- no obvious culprit lesion.   Assessment & Plan    Chest  pain- unclear etiology. MI r/o by one Troponin, will ordered another this am.  CAD- moderate CAD at cath, no obvious culprit lesion  HTN- Continue Diovan  Satin intolerance- LDL 89, she refused statin secondary to prior reaction.   Stress- pt has a history of stress related to her work  Abnormal EKG- on further review this may be secondary to limb lead reversal.   Plan- MD to see- check Troponin this am- ambulate- possible home later.   Signed, Kerin Ransom, PA-C  09/03/2016, 8:21 AM    As above, patient seen and examined. Patient denies chest pain this morning or dyspnea. I have reviewed her admission electrocardiograms and I think her initial ECG showed limb lead reversal. Follow-up ECG normal. Her initial troponin was normal last evening. We will plan a follow-up troponin this morning she had some chest pain last night. If negative we will plan to discharge home with follow-up as scheduled. Her amylase, lipase and d-dimer were normal. I do not think she is having spasm and I will discontinue amlodipine. Continue aspirin for moderate coronary disease. Discontinue Lipitor as she has not tolerated this previously. We will try Crestor 5 mg daily. Check lipids and liver in 4 weeks. Increase metoprolol to 25 mg twice a day. > 30 min PA and physician time D2 Kirk Ruths, MD

## 2016-09-03 NOTE — Clinical Social Work Note (Signed)
CSW received consult regarding: Past harrassment incident that occurred at patient's  job. Want to discuss her options. 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 Ms. Langford reported that all of the teaching staff are aware of what is going on, but does nothing as they are afraid of repercussions. CSW listened and offered support and provided patient with information for the Equal Opportunity Employment Commission. CSW encouraged patient to continue documenting the harrassment and use this information when lodging her complaint. Patient is discharging home today, transported by her daughter.  Britney Captain Givens, MSW, LCSW Licensed Clinical Social Worker [place] [phone]

## 2016-09-03 NOTE — Progress Notes (Signed)
CRITICAL VALUE ALERT  Critical value received:  Troponin 0.03  Date of notification:  09/03/16  Time of notification:  1138  Critical value read back:Yes.    Nurse who received alert:  Leodis Rains RN  MD notified (1st page):  Lurena Joiner PA  Time of first page:  30  MD notified (2nd page):  Time of second page:  Responding MD:  Lurena Joiner PA  Time MD responded:  1 minute

## 2016-09-03 NOTE — Discharge Instructions (Signed)
Radial Site Care °Refer to this sheet in the next few weeks. These instructions provide you with information about caring for yourself after your procedure. Your health care provider may also give you more specific instructions. Your treatment has been planned according to current medical practices, but problems sometimes occur. Call your health care provider if you have any problems or questions after your procedure. °What can I expect after the procedure? °After your procedure, it is typical to have the following: °· Bruising at the radial site that usually fades within 1-2 weeks. °· Blood collecting in the tissue (hematoma) that may be painful to the touch. It should usually decrease in size and tenderness within 1-2 weeks. °Follow these instructions at home: °· Take medicines only as directed by your health care provider. °· You may shower 24-48 hours after the procedure or as directed by your health care provider. Remove the bandage (dressing) and gently wash the site with plain soap and water. Pat the area dry with a clean towel. Do not rub the site, because this may cause bleeding. °· Do not take baths, swim, or use a hot tub until your health care provider approves. °· Check your insertion site every day for redness, swelling, or drainage. °· Do not apply powder or lotion to the site. °· Do not flex or bend the affected arm for 24 hours or as directed by your health care provider. °· Do not push or pull heavy objects with the affected arm for 24 hours or as directed by your health care provider. °· Do not lift over 10 lb (4.5 kg) for 5 days after your procedure or as directed by your health care provider. °· Ask your health care provider when it is okay to: °¨ Return to work or school. °¨ Resume usual physical activities or sports. °¨ Resume sexual activity. °· Do not drive home if you are discharged the same day as the procedure. Have someone else drive you. °· You may drive 24 hours after the procedure  unless otherwise instructed by your health care provider. °· Do not operate machinery or power tools for 24 hours after the procedure. °· If your procedure was done as an outpatient procedure, which means that you went home the same day as your procedure, a responsible adult should be with you for the first 24 hours after you arrive home. °· Keep all follow-up visits as directed by your health care provider. This is important. °Contact a health care provider if: °· You have a fever. °· You have chills. °· You have increased bleeding from the radial site. Hold pressure on the site. °Get help right away if: °· You have unusual pain at the radial site. °· You have redness, warmth, or swelling at the radial site. °· You have drainage (other than a small amount of blood on the dressing) from the radial site. °· The radial site is bleeding, and the bleeding does not stop after 30 minutes of holding steady pressure on the site. °· Your arm or hand becomes pale, cool, tingly, or numb. °This information is not intended to replace advice given to you by your health care provider. Make sure you discuss any questions you have with your health care provider. °Document Released: 07/06/2010 Document Revised: 11/09/2015 Document Reviewed: 12/20/2013 °Elsevier Interactive Patient Education © 2017 Else[employer] Inc[employer] ° °

## 2016-09-03 NOTE — Progress Notes (Signed)
Patient has been discharged via wheelchair. Case management and Social Worker has been at bedside to speak to patient prior discharge.

## 2016-09-03 NOTE — Discharge Summary (Signed)
Discharge Summary    Patient ID: Debra Burgess,  MRN: [id number], DOB/AGE: 1945-12-09 71 y.o.  Admit date: 09/02/2016 Discharge date: 09/03/2016  Primary Care Provider: O'SULLIVAN,MELISSA S. Primary Cardiologist: Dr Stanford Breed  Discharge Diagnoses    Principal Problem:   Chest pain Active Problems:   Hyperlipidemia   Essential hypertension   EKG, abnormal   Allergies Allergies  Allergen Reactions  . Mango Flavor Anaphylaxis  . Lipitor [Atorvastatin] Other (See Comments)    Caused neuropathy to one half of the patient's face    Diagnostic Studies/Procedures    Cardiac cath 09/02/16- _____________   History of Present Illness     71 y/o female admitted 09/02/16 with chest pain and abnormal EKG concerning for ACS  Hospital Course     71 y.o. female admitted 09/02/16 with chest pain. She had been seen previously for chest pain and had a Myoview in July 2017- negative. Her EKG on 09/02/16 showed significant, new, inferior TWI. It was repeated a few hours later and looked the same. Her initial troponin was negative but it was decided to proceed with diagnostic cath. Cath done 09/02/16 as above- no obvious culprit lesion. Her EKG on 09/03/16 was now completley normal. On review by Dr Stanford Breed it was felt she most likely had limb lead reversal on both EKGs from 09/02/16. He feels she can be discharged 09/03/16. F/U has been arranged. No work till 09/09/16.  _____________  Discharge Vitals Blood pressure 124/77, pulse (!) 52, temperature 98 F (36.7 C), temperature source Oral, resp. rate 19, height 5\' 5"  (1.651 m), weight 191 lb 9.3 oz (86.9 kg), last menstrual period 10/15/1997, SpO2 94 %.  Filed Weights   09/02/16 1144 09/03/16 0643  Weight: 192 lb (87.1 kg) 191 lb 9.3 oz (86.9 kg)    Labs & Radiologic Studies    CBC  Recent Labs  09/02/16 1150 09/03/16 0210  WBC 10.5 10.3  HGB 13.7 12.2  HCT 41.2 38.5  MCV 88.0 89.3  PLT 326 893   Basic Metabolic Panel  Recent  Labs  09/02/16 1150 09/03/16 0210  NA 139 139  K 4.0 4.2  CL 105 106  CO2 24 25  GLUCOSE 100* 96  BUN 12 11  CREATININE 0.83 0.86  CALCIUM 9.7 9.1   Liver Function Tests  Recent Labs  09/02/16 1150  AST 29  ALT 31  ALKPHOS 89  BILITOT 0.5  PROT 7.7  ALBUMIN 4.2    Recent Labs  09/02/16 1150 09/02/16 1852  LIPASE 23 13  AMYLASE  --  40   Cardiac Enzymes  Recent Labs  09/02/16 1150  TROPONINI <0.03   BNP Invalid input(s): POCBNP D-Dimer  Recent Labs  09/02/16 1852  DDIMER <0.27   Hemoglobin A1C No results for input(s): HGBA1C in the last 72 hours. Fasting Lipid Panel  Recent Labs  09/03/16 0210  CHOL 169  HDL 47  LDLCALC 89  TRIG 167*  CHOLHDL 3.6   Thyroid Function Tests No results for input(s): TSH, T4TOTAL, T3FREE, THYROIDAB in the last 72 hours.  Invalid input(s): FREET3 _____________  Dg Chest 2 View  Result Date: 09/02/2016 CLINICAL DATA:  Left-sided chest pain. EXAM: CHEST  2 VIEW COMPARISON:  None. FINDINGS: The heart size and mediastinal contours are within normal limits. Both lungs are clear. The visualized skeletal structures are unremarkable. IMPRESSION: Normal chest. Electronically Signed   By: Lorriane Shire M.D.   On: 09/02/2016 12:03   Disposition   Pt is being  discharged home today in good condition.  Follow-up Plans & Appointments    Follow-up Information    [name], [place] Follow up on 09/25/2016.   Specialties:  Cardiology, Radiology Why:  1:30 pm Contact information: [address] [place] [address] [phone]            Discharge Medications   Current Discharge Medication List    START taking these medications   Details  nitroGLYCERIN (NITROSTAT) 0.4 MG SL tablet Place 1 tablet (0.4 mg total) under the tongue every 5 (five) minutes x 3 doses as needed for chest pain. Qty: 25 tablet, Refills: 2    rosuvastatin (CRESTOR) 5 MG tablet Take 1 tablet (5 mg total) by mouth daily. Qty:  30 tablet, Refills: 5      CONTINUE these medications which have CHANGED   Details  metoprolol tartrate (LOPRESSOR) 25 MG tablet Take 1 tablet (25 mg total) by mouth at bedtime. Qty: 180 tablet, Refills: 3      CONTINUE these medications which have NOT CHANGED   Details  acetaminophen (TYLENOL) 325 MG tablet Take 325 mg by mouth every 6 (six) hours as needed (for headaches or body pain).    aspirin 81 MG chewable tablet Chew 81 mg by mouth daily.     Coenzyme Q10 (CO Q-10) 100 MG CAPS Take 100 mg by mouth 2 (two) times daily. GUMMIES    Omega-3 Fatty Acids (FISH OIL) 1000 MG CAPS Take 1 capsule (1,000 mg total) by mouth 2 (two) times daily. Qty: 180 capsule, Refills: 3    valsartan (DIOVAN) 160 MG tablet Take 1 tablet (160 mg total) by mouth daily. Qty: 90 tablet, Refills: 3      STOP taking these medications     atorvastatin (LIPITOR) 10 MG tablet           Outstanding Labs/Studies     Duration of Discharge Encounter   Greater than 30 minutes including physician time.  Angelena Form PA 09/03/2016, 11:13 AM

## 2016-09-04 NOTE — Care Management Note (Signed)
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, dates discussed:    Additional Comments:  Zenon Mayo, RN 09/04/2016, 11:45 AM

## 2016-09-18 ENCOUNTER — Encounter (HOSPITAL_BASED_OUTPATIENT_CLINIC_OR_DEPARTMENT_OTHER): Payer: Self-pay | Admitting: Emergency Medicine

## 2016-09-18 ENCOUNTER — Emergency Department (HOSPITAL_BASED_OUTPATIENT_CLINIC_OR_DEPARTMENT_OTHER)
Admission: EM | Admit: 2016-09-18 | Discharge: 2016-09-19 | Disposition: A | Discharge status: Home / Self Care | Payer: Commercial Managed Care - PPO | Attending: Emergency Medicine | Admitting: Emergency Medicine

## 2016-09-18 ENCOUNTER — Emergency Department (HOSPITAL_BASED_OUTPATIENT_CLINIC_OR_DEPARTMENT_OTHER): Payer: Commercial Managed Care - PPO

## 2016-09-18 DIAGNOSIS — N390 Urinary tract infection, site not specified: Secondary | ICD-10-CM

## 2016-09-18 DIAGNOSIS — Z79899 Other long term (current) drug therapy: Secondary | ICD-10-CM | POA: Insufficient documentation

## 2016-09-18 DIAGNOSIS — I10 Essential (primary) hypertension: Secondary | ICD-10-CM | POA: Insufficient documentation

## 2016-09-18 DIAGNOSIS — Z7982 Long term (current) use of aspirin: Secondary | ICD-10-CM | POA: Insufficient documentation

## 2016-09-18 DIAGNOSIS — R1084 Generalized abdominal pain: Secondary | ICD-10-CM | POA: Diagnosis present

## 2016-09-18 DIAGNOSIS — Z859 Personal history of malignant neoplasm, unspecified: Secondary | ICD-10-CM | POA: Insufficient documentation

## 2016-09-18 DIAGNOSIS — R197 Diarrhea, unspecified: Secondary | ICD-10-CM | POA: Diagnosis not present

## 2016-09-18 LAB — COMPREHENSIVE METABOLIC PANEL
ALT: 24 U/L (ref 14–54)
ANION GAP: 7 (ref 5–15)
AST: 23 U/L (ref 15–41)
Albumin: 3.8 g/dL (ref 3.5–5.0)
Alkaline Phosphatase: 85 U/L (ref 38–126)
BILIRUBIN TOTAL: 0.3 mg/dL (ref 0.3–1.2)
BUN: 18 mg/dL (ref 6–20)
CHLORIDE: 104 mmol/L (ref 101–111)
CO2: 23 mmol/L (ref 22–32)
Calcium: 9.2 mg/dL (ref 8.9–10.3)
Creatinine, Ser: 0.87 mg/dL (ref 0.44–1.00)
GFR calc Af Amer: >60 mL/min (ref >60)
GFR calc non Af Amer: >60 mL/min (ref >60)
GLUCOSE: 106 mg/dL — AB (ref 65–99)
POTASSIUM: 3.8 mmol/L (ref 3.5–5.1)
Sodium: 134 mmol/L — ABNORMAL LOW (ref 135–145)
Total Protein: 6.7 g/dL (ref 6.5–8.1)

## 2016-09-18 LAB — URINALYSIS, MICROSCOPIC (REFLEX): RBC / HPF: NONE SEEN RBC/hpf (ref 0–5)

## 2016-09-18 LAB — URINALYSIS, ROUTINE W REFLEX MICROSCOPIC
Bilirubin Urine: NEGATIVE
Glucose, UA: NEGATIVE mg/dL
Hgb urine dipstick: NEGATIVE
KETONES UR: NEGATIVE mg/dL
Nitrite: NEGATIVE
PH: 5 (ref 5.0–8.0)
Protein, ur: NEGATIVE mg/dL
Specific Gravity, Urine: 1.021 (ref 1.005–1.030)

## 2016-09-18 LAB — CBC WITH DIFFERENTIAL/PLATELET
Basophils Absolute: 0.1 10*3/uL (ref 0.0–0.1)
Basophils Relative: 1 %
EOS PCT: 2 %
Eosinophils Absolute: 0.3 10*3/uL (ref 0.0–0.7)
HCT: 37.1 % (ref 36.0–46.0)
Hemoglobin: 12.2 g/dL (ref 12.0–15.0)
LYMPHS ABS: 4.7 10*3/uL — AB (ref 0.7–4.0)
LYMPHS PCT: 36 %
MCH: 29.3 pg (ref 26.0–34.0)
MCHC: 32.9 g/dL (ref 30.0–36.0)
MCV: 89 fL (ref 78.0–100.0)
Monocytes Absolute: 1.4 10*3/uL — ABNORMAL HIGH (ref 0.1–1.0)
Monocytes Relative: 11 %
Neutro Abs: 6.5 10*3/uL (ref 1.7–7.7)
Neutrophils Relative %: 50 %
PLATELETS: 319 10*3/uL (ref 150–400)
RBC: 4.17 MIL/uL (ref 3.87–5.11)
RDW: 13.4 % (ref 11.5–15.5)
WBC: 12.8 10*3/uL — AB (ref 4.0–10.5)

## 2016-09-18 LAB — LIPASE, BLOOD: Lipase: 20 U/L (ref 11–51)

## 2016-09-18 MED ORDER — IOPAMIDOL (ISOVUE-300) INJECTION 61%
100.0000 mL | Freq: Once | INTRAVENOUS | Status: AC | PRN
  Administered 2016-09-18: 100 mL via INTRAVENOUS

## 2016-09-18 NOTE — ED Provider Notes (Signed)
[facility] Provider Note   CSN: [id number] Arrival date & time: 09/18/16  1947     History   Chief Complaint Chief Complaint  Patient presents with  . Abdominal Pain    HPI Debra Burgess is a 71 y.o. female.  Patient presents with 2 day history of abdominal bloating, suprapubic pain, R sided flank pain and difficulty urinating. The back and flank pain are both rated as 6/10 and cramping. States she has been increasing fluid intake today but still only able to produce a small amount of urine. Also reports 4 episodes of nonbloody diarrhea today. Fever with Tmax 102F today. Denies feelings of urgency, hematuria, dysuria, history of renal disease, or surgery.      Past Medical History:  Diagnosis Date  . Arthritis   . Cancer ([place])   . Diverticulosis   . GERD (gastroesophageal reflux disease)   . Hiatal hernia   . History of chicken pox   . Hyperlipidemia   . Hypertension   . Mononucleosis    2/15  . MVP (mitral valve prolapse)   . Osteopenia 04/30/2015    Patient Active Problem List   Diagnosis Date Noted  . EKG, abnormal 09/02/2016  . Chest pain 09/02/2016  . Nonspecific abnormal finding in stool contents 11/28/2015  . Change in bowel habits 11/28/2015  . Gas 11/28/2015  . Bloating 11/28/2015  . Osteopenia 04/30/2015  . Palpitations 04/10/2015  . Hyperlipidemia 03/16/2015  . GERD (gastroesophageal reflux disease) 03/16/2015  . Mitral valve regurgitation 03/16/2015  . Essential hypertension 03/16/2015    Past Surgical History:  Procedure Laterality Date  . BREAST SURGERY Bilateral 2010   breast reduction  . CORONARY ANGIOGRAPHY  09/02/2016  . CYST EXCISION  06/2016   hyoid bone area per patient  . HIATAL HERNIA REPAIR  2015   "severe hiatal hernia repair", robotic repair with pig skin sheath  . LEFT HEART CATH AND CORONARY ANGIOGRAPHY N/A 09/02/2016   Procedure: Left Heart Cath and Coronary Angiography;  Surgeon: Leonie Man, MD;   Location: [facility];  Service: Cardiovascular;  Laterality: N/A;  . TONSILLECTOMY AND ADENOIDECTOMY  1952    OB History    No data available       Home Medications    Prior to Admission medications   Medication Sig Start Date End Date Taking? Authorizing Provider  acetaminophen (TYLENOL) 325 MG tablet Take 325 mg by mouth every 6 (six) hours as needed (for headaches or body pain).    Historical Provider, MD  aspirin 81 MG chewable tablet Chew 81 mg by mouth daily.     Historical Provider, MD  cephALEXin (KEFLEX) 500 MG capsule Take 1 capsule (500 mg total) by mouth 2 (two) times daily. 09/19/16   Naomia Lenderman, PA-C  Coenzyme Q10 (CO Q-10) 100 MG CAPS Take 100 mg by mouth 2 (two) times daily. [place] Provider, MD  metoprolol tartrate (LOPRESSOR) 25 MG tablet Take 1 tablet (25 mg total) by mouth 2 (two) times daily. 09/03/16   Erlene Quan, PA-C  nitroGLYCERIN (NITROSTAT) 0.4 MG SL tablet Place 1 tablet (0.4 mg total) under the tongue every 5 (five) minutes x 3 doses as needed for chest pain. 09/03/16   Erlene Quan, PA-C  Omega-3 Fatty Acids (FISH OIL) 1000 MG CAPS Take 1 capsule (1,000 mg total) by mouth 2 (two) times daily. Patient taking differently: Take 1 capsule by mouth 2 (two) times daily. BURP-FREE GUMMIES 04/10/15  Lelon Perla, MD  rosuvastatin (CRESTOR) 5 MG tablet Take 1 tablet (5 mg total) by mouth daily. 09/04/16   Erlene Quan, PA-C  valsartan (DIOVAN) 160 MG tablet Take 1 tablet (160 mg total) by mouth daily. 01/10/16   Burtis Junes, NP    Family History Family History  Problem Relation Age of Onset  . Hypertension Mother   . Arthritis Mother   . Colon polyps Mother     had 2 surgeries  . Prostate cancer Father   . Arthritis Maternal Grandmother   . Diabetes Cousin     maternal  . Arthritis Sister   . Ovarian cancer Sister     ovarian and breast  . Breast cancer Sister     Social History Social History  Substance Use Topics  .  Smoking status: Never Smoker  . Smokeless tobacco: Never Used  . Alcohol use 0.0 oz/week     Comment: Rarely     Allergies   Mango flavor and Lipitor [atorvastatin]   Review of Systems Review of Systems  Constitutional: Positive for fever. Negative for appetite change and chills.  HENT: Negative for ear pain, rhinorrhea, sneezing and sore throat.   Eyes: Negative for photophobia and visual disturbance.  Respiratory: Negative for cough, chest tightness, shortness of breath and wheezing.   Cardiovascular: Negative for chest pain and palpitations.  Gastrointestinal: Positive for abdominal distention, abdominal pain, diarrhea and nausea. Negative for blood in stool, constipation and vomiting.  Genitourinary: Positive for decreased urine volume, difficulty urinating and flank pain. Negative for dysuria, hematuria and urgency.  Musculoskeletal: Negative for myalgias.  Skin: Negative for rash.  Neurological: Positive for light-headedness. Negative for dizziness and weakness.     Physical Exam Updated Vital Signs BP 137/84   Pulse 68   Temp 97.6 F (36.4 C) (Oral)   Resp 18   Ht 5' 5.5" (1.664 m)   Wt 86.6 kg   LMP 10/15/1997   SpO2 100%   BMI 31.30 kg/m   Physical Exam  Constitutional: She is oriented to person, place, and time. She appears well-developed and well-nourished. No distress.  HENT:  Head: Normocephalic and atraumatic.  Nose: Nose normal.  Eyes: Conjunctivae and EOM are normal. Right eye exhibits no discharge. Left eye exhibits no discharge. No scleral icterus.  Neck: Normal range of motion. Neck supple.  Cardiovascular: Normal rate, regular rhythm, normal heart sounds and intact distal pulses.  Exam reveals no gallop and no friction rub.   No murmur heard. Pulmonary/Chest: Effort normal and breath sounds normal. No respiratory distress.  Abdominal: Soft. Bowel sounds are normal. She exhibits distension. There is tenderness (RLQ and R CVA tenderness). There is  no guarding.  Musculoskeletal: Normal range of motion. She exhibits no edema.  Neurological: She is alert and oriented to person, place, and time. She exhibits normal muscle tone. Coordination normal.  Skin: Skin is warm and dry. No rash noted. She is not diaphoretic. No erythema. No pallor.  Psychiatric: She has a normal mood and affect.  Nursing note and vitals reviewed.    ED Treatments / Results  Labs (all labs ordered are listed, but only abnormal results are displayed) Labs Reviewed  URINALYSIS, ROUTINE W REFLEX MICROSCOPIC - Abnormal; Notable for the following:       Result Value   APPearance CLOUDY (*)    Leukocytes, UA LARGE (*)    All other components within normal limits  CBC WITH DIFFERENTIAL/PLATELET - Abnormal; Notable for the following:  WBC 12.8 (*)    Lymphs Abs 4.7 (*)    Monocytes Absolute 1.4 (*)    All other components within normal limits  COMPREHENSIVE METABOLIC PANEL - Abnormal; Notable for the following:    Sodium 134 (*)    Glucose, Bld 106 (*)    All other components within normal limits  URINALYSIS, MICROSCOPIC (REFLEX) - Abnormal; Notable for the following:    Bacteria, UA RARE (*)    Squamous Epithelial / LPF 0-5 (*)    All other components within normal limits  URINE CULTURE  LIPASE, BLOOD    EKG  EKG Interpretation None       Radiology Ct Abdomen Pelvis W Contrast  Result Date: 09/19/2016 CLINICAL DATA:  Lower abdominal pain for 2 days. Diarrhea. Mild leukocytosis. EXAM: CT ABDOMEN AND PELVIS WITH CONTRAST TECHNIQUE: Multidetector CT imaging of the abdomen and pelvis was performed using the standard protocol following bolus administration of intravenous contrast. CONTRAST:  158mL ISOVUE-300 IOPAMIDOL (ISOVUE-300) INJECTION 61% COMPARISON:  10/03/2015 FINDINGS: Lower chest: No acute abnormality. Hepatobiliary: No focal liver abnormality is seen. No gallstones, gallbladder wall thickening, or biliary dilatation. Pancreas: Unremarkable. No  pancreatic ductal dilatation or surrounding inflammatory changes. Spleen: Normal in size without focal abnormality. Adrenals/Urinary Tract: Adrenal glands are unremarkable. Kidneys are normal, without renal calculi, focal lesion, or hydronephrosis. Bladder is unremarkable. Stomach/Bowel: Small hiatal hernia. Stomach is otherwise unremarkable. Small bowel is normal. Appendix is normal. Colon is remarkable only for uncomplicated moderate sigmoid diverticulosis. Vascular/Lymphatic: No significant vascular findings are present. No enlarged abdominal or pelvic lymph nodes. Reproductive: Uterus and bilateral adnexa are unremarkable. Other: No focal inflammation.  No ascites. Small fat containing ventral hernia through the left rectus abdominus muscle 9 cm above the level of the umbilicus, series 2, image 40. No acute inflammation associated with the hernia. Musculoskeletal: No significant skeletal lesion. IMPRESSION: 1. Small hiatal hernia. 2. Small ventral hernia to the left of midline above the umbilicus 3. No acute findings are evident in the abdomen or pelvis. Electronically Signed   By: Andreas Newport M.D.   On: 09/19/2016 00:43    Procedures Procedures (including critical care time)  Medications Ordered in ED Medications  iopamidol (ISOVUE-300) 61 % injection 100 mL (100 mLs Intravenous Contrast Given 09/18/16 2357)  sodium chloride 0.9 % bolus 1,000 mL (0 mLs Intravenous Stopped 09/19/16 0155)     Initial Impression / Assessment and Plan / ED Course  I have reviewed the triage vital signs and the nursing notes.  Pertinent labs & imaging results that were available during my care of the patient were reviewed by me and considered in my medical decision making (see chart for details).     Patient's history and symptoms concerning for UTI vs. Pyelo vs. Gastroenteritis vs. Renal failure. CBC showed no elevation in BUN/Cr. CMP showed no electrolyte abnormalities. UA was positive for bacteria and  leukocytes. This was sent for culture. CT abd/pel showed no evidence of pyelo or other kidney abnormality. There is a small hiatal hernia present, however no inflammation and this would not explain symptoms. Likely due to UTI. Sent urine for culture to determine cause.  Patient given IVF in here to improve hydration. Given Keflex x10 days. Advised to follow up with culture results. Return precautions given. Patient agreed to plan.  Final Clinical Impressions(s) / ED Diagnoses   Final diagnoses:  Generalized abdominal pain  Lower urinary tract infectious disease    New Prescriptions Discharge Medication List as of 09/19/2016  1:34 AM    START taking these medications   Details  cephALEXin (KEFLEX) 500 MG capsule Take 1 capsule (500 mg total) by mouth 2 (two) times daily., Starting Thu 09/19/2016, Print         Sebastiana Wuest Ashley, PA-C 09/19/16 4383    Julianne Rice, MD 09/19/16 [phone]

## 2016-09-18 NOTE — ED Notes (Signed)
c/o back and abd low mid  Pain dizziness,  Unable to urinate in last 7 hours, bladder scan shows 118 cc urine in bladder

## 2016-09-18 NOTE — ED Triage Notes (Signed)
abd pain and back pain, unable to urinate

## 2016-09-19 MED ORDER — SODIUM CHLORIDE 0.9 % IV BOLUS (SEPSIS)
1000.0000 mL | Freq: Once | INTRAVENOUS | Status: AC
  Administered 2016-09-19: 1000 mL via INTRAVENOUS

## 2016-09-19 MED ORDER — CEPHALEXIN 500 MG PO CAPS: 500.0000 mg | ORAL_CAPSULE | Freq: Two times a day (BID) | ORAL | 0 refills | Status: DC

## 2016-09-19 NOTE — Discharge Instructions (Signed)
Begin taking Keflex twice daily for 1 days. Increase fluid intake. Return to ED for worsening symptoms, blood in stool or urine or fevers.

## 2016-09-19 NOTE — ED Notes (Signed)
Patient transported to CT 

## 2016-09-20 LAB — URINE CULTURE: Culture: <10000 — AB

## 2016-09-25 ENCOUNTER — Ambulatory Visit (INDEPENDENT_AMBULATORY_CARE_PROVIDER_SITE_OTHER): Payer: Commercial Managed Care - PPO | Admitting: Physician Assistant

## 2016-09-25 ENCOUNTER — Encounter: Payer: Self-pay | Admitting: Physician Assistant

## 2016-09-25 VITALS — BP 122/74 | HR 75 | Ht 65.5 in | Wt 192.0 lb

## 2016-09-25 DIAGNOSIS — R0609 Other forms of dyspnea: Secondary | ICD-10-CM | POA: Diagnosis not present

## 2016-09-25 DIAGNOSIS — E785 Hyperlipidemia, unspecified: Secondary | ICD-10-CM

## 2016-09-25 DIAGNOSIS — I10 Essential (primary) hypertension: Secondary | ICD-10-CM

## 2016-09-25 DIAGNOSIS — E781 Pure hyperglyceridemia: Secondary | ICD-10-CM | POA: Diagnosis not present

## 2016-09-25 DIAGNOSIS — M545 Low back pain, unspecified: Secondary | ICD-10-CM

## 2016-09-25 DIAGNOSIS — I2510 Atherosclerotic heart disease of native coronary artery without angina pectoris: Secondary | ICD-10-CM | POA: Diagnosis not present

## 2016-09-25 MED ORDER — ATORVASTATIN CALCIUM 10 MG PO TABS: 10.0000 mg | ORAL_TABLET | Freq: Every day | ORAL | 3 refills | Status: DC

## 2016-09-25 NOTE — Progress Notes (Signed)
Cardiology Office Note    Date:  3/47/4259   ID:  Debra Burgess, DOB [birth year], MRN [id number]  PCP:  Nance Pear., NP  Cardiologist:  Dr. Stanford Breed  Chief Complaint  Patient presents with  . Shortness of Breath    pt states only when pushing a cart uphill or running   . Follow-up    seen for Dr. Stanford Breed    History of Present Illness:  Debra Burgess is a 71 y.o. female with PMH of HTN, HLD intolerant to statin, hiatal hernia, MVP but no prior history of CAD presented To the hospital on 09/02/2016 for evaluation of chest pain. EKG showed new T wave inversion in the inferior leads. Cardiac catheterization performed on 09/02/2016 showed 35% proximal LAD, 45% mid left circumflex which is a potential site for spasm, 60% lateral ramus branch lesion, EF greater than 65%. No culprit lesion was identified to explain the patient's symptom or EKG changes. However cannot rule out coronary artery spasm. D-dimer was negative on 09/02/2016. Dr. Stanford Breed was able to convince her to start on Crestor 5 mg daily as her triglyceride was 167 and her LDL is 89. We plan for repeat fasting lipid and LFTs in 6 weeks.  Since discharge, she has come back to the emergency room on 09/18/2016 with abdominal pain and fever. She was tested positive for UTI and was prescribed Keflex. Urine culture shows <10,000 colonies which was insignificant growth. CT of abdomen and pelvis obtained on 09/19/2016 showed small hiatal hernia, small ventral hernia to the left of midline above the umbilicus, otherwise no acute findings.  She presents today for cardiology office follow-up. Her back pain is improving but still there. She points to a single location at her lower back. She had soreness on palpation. I think this is likely musculoskeletal in nature. As far as the chest discomfort, she had a single episode a week ago in [place], it quickly went away after a single nitroglycerine. I did discuss with her regarding her Crestor,  she is no longer taking it. She says she had a lot of abdominal pain and attributed to Crestor. She took it off for 2 days and the symptom went away. As a trial, she actually restarted the 5 mg Crestor, abdominal pain recurred. Therefore she completely discontinued it. She says when she was followed by her previous cardiologist in [place], she was placed on Lipitor and had a lot of side effects including neuropathy, his previous cardiologist had recommended to her a trial with very low-dose Lipitor to see if she is able to tolerate it. This was never done, but she is interested in giving another try as she too is eager to have cholesterol under control. She is also taking omega-3 fatty acid 1000 mg daily despite our records indicating she is taking it twice a day. She says from this point forward, she will take it twice a day. We plan for 6 weeks fasting lipid panel and LFTs. She also mentions she is changing her job in 5 weeks as she is having some shortness of breath with exertion, esp when pushing a cart up a hill. However she does not appear to be volume overloaded on physical exam. Otherwise she is doing well from cardiology perspective.    Past Medical History:  Diagnosis Date  . Arthritis   . Cancer ([place])   . Diverticulosis   . GERD (gastroesophageal reflux disease)   . Hiatal hernia   . History of chicken pox   .  Hyperlipidemia   . Hypertension   . Mononucleosis    2/15  . MVP (mitral valve prolapse)   . Osteopenia 04/30/2015    Past Surgical History:  Procedure Laterality Date  . BREAST SURGERY Bilateral 2010   breast reduction  . CORONARY ANGIOGRAPHY  09/02/2016  . CYST EXCISION  06/2016   hyoid bone area per patient  . HIATAL HERNIA REPAIR  2015   "severe hiatal hernia repair", robotic repair with pig skin sheath  . LEFT HEART CATH AND CORONARY ANGIOGRAPHY N/A 09/02/2016   Procedure: Left Heart Cath and Coronary Angiography;  Surgeon: Leonie Man, MD;  Location: [facility];  Service: Cardiovascular;  Laterality: N/A;  . TONSILLECTOMY AND ADENOIDECTOMY  1952    Current Medications: Outpatient Medications Prior to Visit  Medication Sig Dispense Refill  . acetaminophen (TYLENOL) 325 MG tablet Take 325 mg by mouth every 6 (six) hours as needed (for headaches or body pain).    Marland Kitchen aspirin 81 MG chewable tablet Chew 81 mg by mouth daily.     . cephALEXin (KEFLEX) 500 MG capsule Take 1 capsule (500 mg total) by mouth 2 (two) times daily. 20 capsule 0  . Coenzyme Q10 (CO Q-10) 100 MG CAPS Take 100 mg by mouth 2 (two) times daily. GUMMIES    . metoprolol tartrate (LOPRESSOR) 25 MG tablet Take 1 tablet (25 mg total) by mouth 2 (two) times daily. 180 tablet 3  . nitroGLYCERIN (NITROSTAT) 0.4 MG SL tablet Place 1 tablet (0.4 mg total) under the tongue every 5 (five) minutes x 3 doses as needed for chest pain. 25 tablet 2  . Omega-3 Fatty Acids (FISH OIL) 1000 MG CAPS Take 1 capsule (1,000 mg total) by mouth 2 (two) times daily. (Patient taking differently: Take 1 capsule by mouth 2 (two) times daily. BURP-FREE GUMMIES) 180 capsule 3  . valsartan (DIOVAN) 160 MG tablet Take 1 tablet (160 mg total) by mouth daily. 90 tablet 3  . rosuvastatin (CRESTOR) 5 MG tablet Take 1 tablet (5 mg total) by mouth daily. 30 tablet 5   No facility-administered medications prior to visit.      Allergies:   Mango flavor and Lipitor [atorvastatin]   Social History   Social History  . Marital status: Single    Spouse name: N/A  . Number of children: 4  . Years of education: N/A   Occupational History  . teacher    Social History Main Topics  . Smoking status: Never Smoker  . Smokeless tobacco: Never Used  . Alcohol use 0.0 oz/week     Comment: Rarely  . Drug use: No  . Sexual activity: Not Asked   Other Topics Concern  . None   Social History Narrative   Recently moved to be near her daughter Mosetta Putt.     Has 4 children   Teaches latin Youth worker)  Technology, STEM (middle school) Writer at [employer]   Completed masters degree   Divorced      Family History:  The patient's family history includes Arthritis in her maternal grandmother, mother, and sister; Breast cancer in her sister; Colon polyps in her mother; Diabetes in her cousin; Hypertension in her mother; Ovarian cancer in her sister; Prostate cancer in her father.   ROS:   Please see the history of present illness.    ROS All other systems reviewed and are negative.   PHYSICAL EXAM:   VS:  BP 122/74   Pulse 75  Ht 5' 5.5" (1.664 m)   Wt 192 lb (87.1 kg)   LMP 10/15/1997   SpO2 94%   BMI 31.46 kg/m    GEN: Well nourished, well developed, in no acute distress  HEENT: normal  Neck: no JVD, carotid bruits, or masses Cardiac: RRR; no murmurs, rubs, or gallops,no edema  Respiratory:  clear to auscultation bilaterally, normal work of breathing GI: soft, nontender, nondistended, + BS MS: no deformity or atrophy  Skin: warm and dry, no rash Neuro:  Alert and Oriented x 3, Strength and sensation are intact Psych: euthymic mood, full affect  Wt Readings from Last 3 Encounters:  09/25/16 192 lb (87.1 kg)  09/18/16 191 lb (86.6 kg)  09/03/16 191 lb 9.3 oz (86.9 kg)      Studies/Labs Reviewed:   EKG:  EKG is not ordered today.    Recent Labs: 02/05/2016: TSH 0.90 09/18/2016: ALT 24; BUN 18; Creatinine, Ser 0.87; Hemoglobin 12.2; Platelets 319; Potassium 3.8; Sodium 134   Lipid Panel    Component Value Date/Time   CHOL 169 09/03/2016 0210   TRIG 167 (H) 09/03/2016 0210   HDL 47 09/03/2016 0210   CHOLHDL 3.6 09/03/2016 0210   VLDL 33 09/03/2016 0210   LDLCALC 89 09/03/2016 0210    Additional studies/ records that were reviewed today include:   Cath 09/02/2016 Conclusion     Prox LAD lesion, 35 %stenosed. Non-flow-limiting  Mid Cx lesion, 45 %stenosed. Potential site of spasm  Lat Ramus branch lesion, 60 %stenosed.  Small-caliber vessel very tortuous. Not PCI target  There is hyperdynamic left ventricular systolic function.  The left ventricular ejection fraction is greater than 65% by visual estimate.  LV end diastolic pressure is normal.   Angiographically no culprit lesion to explain the patient's symptoms and abnormal EKG.  Cannot exclude coronary artery spasm.     CT of abdomen and pelvis 09/19/2016 IMPRESSION: 1. Small hiatal hernia. 2. Small ventral hernia to the left of midline above the umbilicus 3. No acute findings are evident in the abdomen or pelvis  ASSESSMENT:    1. Coronary artery disease involving native coronary artery of native heart without angina pectoris   2. Hypertriglyceridemia   3. Dyslipidemia   4. Essential hypertension   5. Acute midline low back pain without sciatica   6. DOE (dyspnea on exertion)      PLAN:  In order of problems listed above:  1. CAD: Nonobstructive on recent cardiac catheterization. Symptom has improved. Continue on aspirin  2. Hyperlipidemia: She is not taking any Crestor that was prescribed in the hospital. She attributed to Crestor to abdominal pain, she says she did take herself off of Crestor for 2 days and abdominal pain resolved. As a trial, she actually put herself back on the Crestor, symptom recurred. She says her cardiologist in [place] gave her a dose of Lipitor before which caused neuropathy as side effect, however he did discuss with her at one point in the past to potentially try a very low dose Lipitor. She says she did not have as much side effect with Lipitor as she did recently with Crestor. She is willing to give it another try at a very low dose. I will start her on 10 mg daily of Lipitor. She is also taking fish oil once a day despite our records indicating she is supposed to take twice a day, she will start taking it twice a day. She will need 6 weeks fasting LFT and FLP  3. Hypertension: Blood pressure well controlled  on current. Losartan and metoprolol  4. Lower back pain: Likely musculoskeletal in nature. Recently treated for UTI, symptom has improved, however has very focal almost pinpoint soreness on palpation in the lower back. Percussion near the kidney area did not elicit any discomfort.  5. DOE: likely not cardiac in origin, encourage activity as tolerated and weight loss.     Medication Adjustments/Labs and Tests Ordered: Current medicines are reviewed at length with the patient today.  Concerns regarding medicines are outlined above.  Medication changes, Labs and Tests ordered today are listed in the Patient Instructions below. Patient Instructions  Your physician has recommended you make the following change in your medication:  -- STOP crestor -- START atorvastatin 10mg  once daily at bedtime -- take fish oil twice daily  Your physician recommends that you return for lab work in: 6 weeks - fasting (lipid panel, liver function test) -- if you are unable to tolerate lipitor, please all our office // you will NOT need blood work if unable to take med  Your physician recommends that you schedule a follow-up appointment in: [place] with Dr. Stanford Breed       Signed, [place], [place]  09/26/2016 5:41 AM    [address], [place], [place]  [address] Phone: [phone]; Fax: [phone]

## 2016-09-25 NOTE — Patient Instructions (Signed)
Your physician has recommended you make the following change in your medication:  -- STOP crestor -- START atorvastatin 10mg  once daily at bedtime -- take fish oil twice daily  Your physician recommends that you return for lab work in: 6 weeks - fasting (lipid panel, liver function test) -- if you are unable to tolerate lipitor, please all our office // you will NOT need blood work if unable to take med  Your physician recommends that you schedule a follow-up appointment in: [place] with Dr. Stanford Breed

## 2016-09-26 ENCOUNTER — Other Ambulatory Visit: Payer: Self-pay | Admitting: Family

## 2016-09-26 DIAGNOSIS — Z1231 Encounter for screening mammogram for malignant neoplasm of breast: Secondary | ICD-10-CM

## 2016-10-01 ENCOUNTER — Encounter (HOSPITAL_BASED_OUTPATIENT_CLINIC_OR_DEPARTMENT_OTHER): Payer: Self-pay

## 2016-10-01 ENCOUNTER — Ambulatory Visit (HOSPITAL_BASED_OUTPATIENT_CLINIC_OR_DEPARTMENT_OTHER)
Admission: RE | Admit: 2016-10-01 | Discharge: 2016-10-01 | Disposition: A | Discharge status: Home / Self Care | Payer: Commercial Managed Care - PPO | Source: Ambulatory Visit | Attending: Family | Admitting: Family

## 2016-10-01 DIAGNOSIS — Z1231 Encounter for screening mammogram for malignant neoplasm of breast: Secondary | ICD-10-CM | POA: Diagnosis present

## 2016-10-01 HISTORY — DX: Diffuse cystic mastopathy of unspecified breast: N60.19

## 2016-11-19 ENCOUNTER — Telehealth: Payer: Self-pay | Admitting: *Deleted

## 2016-11-19 MED ORDER — LOSARTAN POTASSIUM 50 MG PO TABS: 50.0000 mg | ORAL_TABLET | Freq: Every day | ORAL | 3 refills | Status: DC

## 2016-11-19 NOTE — Telephone Encounter (Signed)
She is on 160mg  valsartan, technically equivalent to about 100mg  daily losartan. Recommend switch to 50mg  losartan daily initially, if SBP > 120 consistently for 3 days, then increase to 100mg  daily.

## 2016-11-19 NOTE — Telephone Encounter (Signed)
Please ask  [name], PA, since he has seen pt last. Cecille Rubin is on vacation and Dr. Stanford Breed is pt's cardiologist.

## 2016-11-19 NOTE — Telephone Encounter (Signed)
Spoke with pt, aware of hoa's recommendations. New script sent to the pharmacy

## 2016-11-19 NOTE — Telephone Encounter (Signed)
Juanda Crumble R routed conversation to You 22 minutes ago (3:41 PM)    Juanda Crumble R 26 minutes ago (3:37 PM)      Please ask  [name], [place], since he has seen pt last. Cecille Rubin is on vacation and Dr. Stanford Breed is pt's cardiologist.       Documentation     Tamsen Snider  Felton Buczynski 26 minutes ago (3:37 PM)    You  Juanda Crumble R 1 hour ago (2:39 PM)    Pharmacy asking for Losartan instead of Valsartan as it cost to much, please advise, thanks  (Routing comment)

## 2016-12-12 NOTE — Progress Notes (Deleted)
HPI: FU MVP and CAD. Carotid Dopplers February 2012 showed no significant stenosis. Echocardiogram in 2012 showed normal LV function with no significant valvular heart disease. Patient admitted with CP March 2018. Cardiac catheterization at that time showed a 35% proximal LAD, 45% mid circumflex, 60% lateral ramus and hyperdynamic LV function.  Current Outpatient Prescriptions  Medication Sig Dispense Refill  . acetaminophen (TYLENOL) 325 MG tablet Take 325 mg by mouth every 6 (six) hours as needed (for headaches or body pain).    Marland Kitchen aspirin 81 MG chewable tablet Chew 81 mg by mouth daily.     Marland Kitchen atorvastatin (LIPITOR) 10 MG tablet Take 1 tablet (10 mg total) by mouth at bedtime. 90 tablet 3  . cephALEXin (KEFLEX) 500 MG capsule Take 1 capsule (500 mg total) by mouth 2 (two) times daily. 20 capsule 0  . Coenzyme Q10 (CO Q-10) 100 MG CAPS Take 100 mg by mouth 2 (two) times daily. GUMMIES    . losartan (COZAAR) 50 MG tablet Take 1 tablet (50 mg total) by mouth daily. 90 tablet 3  . metoprolol tartrate (LOPRESSOR) 25 MG tablet Take 1 tablet (25 mg total) by mouth 2 (two) times daily. 180 tablet 3  . nitroGLYCERIN (NITROSTAT) 0.4 MG SL tablet Place 1 tablet (0.4 mg total) under the tongue every 5 (five) minutes x 3 doses as needed for chest pain. 25 tablet 2  . Omega-3 Fatty Acids (FISH OIL) 1000 MG CAPS Take 1 capsule (1,000 mg total) by mouth 2 (two) times daily. (Patient taking differently: Take 1 capsule by mouth 2 (two) times daily. BURP-FREE GUMMIES) 180 capsule 3   No current facility-administered medications for this visit.      Past Medical History:  Diagnosis Date  . Arthritis   . Cancer ([place])   . Diverticulosis   . Fibrocystic breast   . GERD (gastroesophageal reflux disease)   . Hiatal hernia   . History of chicken pox   . Hyperlipidemia   . Hypertension   . Mononucleosis    2/15  . MVP (mitral valve prolapse)   . Osteopenia 04/30/2015    Past Surgical History:    Procedure Laterality Date  . BREAST SURGERY Bilateral 2010   breast reduction  . CORONARY ANGIOGRAPHY  09/02/2016  . CYST EXCISION  06/2016   hyoid bone area per patient  . HIATAL HERNIA REPAIR  2015   "severe hiatal hernia repair", robotic repair with pig skin sheath  . LEFT HEART CATH AND CORONARY ANGIOGRAPHY N/A 09/02/2016   Procedure: Left Heart Cath and Coronary Angiography;  Surgeon: Leonie Man, MD;  Location: [facility];  Service: Cardiovascular;  Laterality: N/A;  . REDUCTION MAMMAPLASTY Bilateral    2011  . TONSILLECTOMY AND ADENOIDECTOMY  1952    Social History   Social History  . Marital status: Single    Spouse name: N/A  . Number of children: 4  . Years of education: N/A   Occupational History  . teacher    Social History Main Topics  . Smoking status: Never Smoker  . Smokeless tobacco: Never Used  . Alcohol use 0.0 oz/week     Comment: Rarely  . Drug use: No  . Sexual activity: Not on file   Other Topics Concern  . Not on file   Social History Narrative   Recently moved to be near her daughter Mosetta Putt.     Has 4 children   Teaches latin Youth worker) Technology, STEM (  middle school) Writer at [employer]   Completed masters degree   Divorced     Family History  Problem Relation Age of Onset  . Hypertension Mother   . Arthritis Mother   . Colon polyps Mother        had 2 surgeries  . Prostate cancer Father   . Arthritis Maternal Grandmother   . Diabetes Cousin        maternal  . Arthritis Sister   . Ovarian cancer Sister        ovarian and breast  . Breast cancer Sister     ROS: no fevers or chills, productive cough, hemoptysis, dysphasia, odynophagia, melena, hematochezia, dysuria, hematuria, rash, seizure activity, orthopnea, PND, pedal edema, claudication. Remaining systems are negative.  Physical Exam: Well-developed well-nourished in no acute distress.  Skin is warm and dry.  HEENT is  normal.  Neck is supple.  Chest is clear to auscultation with normal expansion.  Cardiovascular exam is regular rate and rhythm.  Abdominal exam nontender or distended. No masses palpated. Extremities show no edema. neuro grossly intact  ECG- personally reviewed  A/P  1  Debra Ruths, MD

## 2016-12-15 NOTE — Progress Notes (Addendum)
[place] at [facility] [address] [place], [place] [address] [phone] [phone]  Date:  01/16/1571   Name:  Debra Burgess   DOB:  [birth year]   MRN:  [id number]  PCP:  [name], NP    Chief Complaint: Dysuria (c/o burning during urination, vaginal irritation, clear discharge. Will need refill on )   History of Present Illness:  Debra Burgess is a 71 y.o. very pleasant female patient who presents with the following:  Admitted in March with chest pain- negative cath however:  71 y.o.femaleadmitted 09/02/16 with chest pain. She had been seen previously for chest pain and had a Myoview in July 2017- negative. Her EKG on 09/02/16 showed significant, new, inferior TWI. It was repeated a few hours later and looked the same. Her initial troponin was negative but it was decided to proceed with diagnostic cath. Cath done 09/02/16 as above- no obvious culprit lesion. Her EKG on 09/03/16 was now completley normal. On review by Dr Stanford Breed it was felt she most likely had limb lead reversal on both EKGs from 09/02/16. He feels she can be discharged 09/03/16. F/U has been arranged. No work till 09/09/16.   Here today with concern of possible UTI or STI- see below  BP Readings from Last 3 Encounters:  12/16/16 130/82  09/25/16 122/74  09/19/16 132/82  her SBP is 120-130.  Dr. Eulas Post changed her to losartan as valsartan was too expensive.  She will start taking 100 mg per his advice. She is also on lopressor for her HTN She has been under a lot of stress- recently resigned from her job in April and she also moved into a new home.  She is happy in her new home, has lost some weight and is feeling well.  She was not able to tolerate a statin so she is hoping to control her lipids with diet and exercise She does need to find another job at least part time for a few more years.    She is concerned about a possible STI- has had recurrent urinary and vaginal  sx and cannot find an explnation She had a recent ?UA done at an urgent care for dysuria which looked ok.  She then tried treating herself with monistat for possible yeast.   Her urine has been clear, no backache. Her vulva has felt inflamed however off an on Not SA for 2.5 years.  However she thinks that her previous partner (of 20 years) was cheating on her so  She has NOT had a hysterectomy and would like to have a pap today.   Unsure date of her last pap  She finished her monistat 3-4 days ago.  She did a 5 day pack   No nausea, vomiting or diarrhea   She is using nitro on occasion and does need a refill of this.  Dr. Eulas Post is aware that she is using this prn for stable angina.     Patient Active Problem List   Diagnosis Date Noted  . EKG, abnormal 09/02/2016  . Chest pain 09/02/2016  . Nonspecific abnormal finding in stool contents 11/28/2015  . Change in bowel habits 11/28/2015  . Gas 11/28/2015  . Bloating 11/28/2015  . Osteopenia 04/30/2015  . Palpitations 04/10/2015  . Hyperlipidemia 03/16/2015  . GERD (gastroesophageal reflux disease) 03/16/2015  . Mitral valve regurgitation 03/16/2015  . Essential hypertension 03/16/2015    Past Medical History:  Diagnosis Date  . Arthritis   .  Cancer ([place])   . Diverticulosis   . Fibrocystic breast   . GERD (gastroesophageal reflux disease)   . Hiatal hernia   . History of chicken pox   . Hyperlipidemia   . Hypertension   . Mononucleosis    2/15  . MVP (mitral valve prolapse)   . Osteopenia 04/30/2015    Past Surgical History:  Procedure Laterality Date  . BREAST SURGERY Bilateral 2010   breast reduction  . CORONARY ANGIOGRAPHY  09/02/2016  . CYST EXCISION  06/2016   hyoid bone area per patient  . HIATAL HERNIA REPAIR  2015   "severe hiatal hernia repair", robotic repair with pig skin sheath  . LEFT HEART CATH AND CORONARY ANGIOGRAPHY N/A 09/02/2016   Procedure: Left Heart Cath and Coronary Angiography;  Surgeon: Leonie Man, MD;  Location: [place] [facility];  Service: Cardiovascular;  Laterality: N/A;  . REDUCTION MAMMAPLASTY Bilateral    2011  . TONSILLECTOMY AND ADENOIDECTOMY  1952    Social History  Substance Use Topics  . Smoking status: Never Smoker  . Smokeless tobacco: Never Used  . Alcohol use 0.0 oz/week     Comment: Rarely    Family History  Problem Relation Age of Onset  . Hypertension Mother   . Arthritis Mother   . Colon polyps Mother        had 2 surgeries  . Prostate cancer Father   . Arthritis Maternal Grandmother   . Diabetes Cousin        maternal  . Arthritis Sister   . Ovarian cancer Sister        ovarian and breast  . Breast cancer Sister     Allergies  Allergen Reactions  . Mango Flavor Anaphylaxis  . Lipitor [Atorvastatin] Other (See Comments)    Caused neuropathy to one half of the patient's face    Medication list has been reviewed and updated.  Current Outpatient Prescriptions on File Prior to Visit  Medication Sig Dispense Refill  . acetaminophen (TYLENOL) 325 MG tablet Take 325 mg by mouth every 6 (six) hours as needed (for headaches or body pain).    Marland Kitchen aspirin 81 MG chewable tablet Chew 81 mg by mouth daily.     . Coenzyme Q10 (CO Q-10) 100 MG CAPS Take 100 mg by mouth 2 (two) times daily. GUMMIES    . losartan (COZAAR) 50 MG tablet Take 1 tablet (50 mg total) by mouth daily. 90 tablet 3  . metoprolol tartrate (LOPRESSOR) 25 MG tablet Take 1 tablet (25 mg total) by mouth 2 (two) times daily. 180 tablet 3  . nitroGLYCERIN (NITROSTAT) 0.4 MG SL tablet Place 1 tablet (0.4 mg total) under the tongue every 5 (five) minutes x 3 doses as needed for chest pain. 25 tablet 2  . Omega-3 Fatty Acids (FISH OIL) 1000 MG CAPS Take 1 capsule (1,000 mg total) by mouth 2 (two) times daily. (Patient taking differently: Take 1 capsule by mouth 2 (two) times daily. BURP-FREE GUMMIES) 180 capsule 3   No current facility-administered medications on file prior to visit.      Review of Systems:  As per HPI- otherwise negative. Wt Readings from Last 3 Encounters:  12/16/16 182 lb 9.6 oz (82.8 kg)  09/25/16 192 lb (87.1 kg)  09/18/16 191 lb (86.6 kg)   Has lost weight intentionally Had a mild cough recently that resolved   Physical Examination: Vitals:   12/16/16 1113  BP: 130/82  Pulse: (!) 59  Temp:  97.7 F (36.5 C)   Vitals:   12/16/16 1113  Weight: 182 lb 9.6 oz (82.8 kg)  Height: 5' 5.5" (1.664 m)   Body mass index is 29.92 kg/m. Ideal Body Weight: Weight in (lb) to have BMI = 25: 152.2  GEN: WDWN, NAD, Non-toxic, A & O x 3, obese, looks well HEENT: Atraumatic, Normocephalic. Neck supple. No masses, No LAD. Ears and Nose: No external deformity. CV: RRR, No M/G/R. No JVD. No thrill. No extra heart sounds. PULM: CTA B, no wheezes, crackles, rhonchi. No retractions. No resp. distress. No accessory muscle use. ABD: S, NT, ND, +BS. No rebound. No HSM. EXTR: No c/c/e NEURO Normal gait.  PSYCH: Normally interactive. Conversant. Not depressed or anxious appearing.  Calm demeanor.  Pelvic: normal, no vaginal lesions or discharge. Uterus normal, no CMT, no adnexal tendereness or masses Mild atrophy normal for age Results for orders placed or performed in visit on 12/16/16  POCT Urinalysis Dipstick (Automated)  Result Value Ref Range   Color, UA yellow    Clarity, UA clear    Glucose, UA negative    Bilirubin, UA negative    Ketones, UA negative    Spec Grav, UA 1.015 1.010 - 1.025   Blood, UA negative    pH, UA 6.0 5.0 - 8.0   Protein, UA negative    Urobilinogen, UA 0.2 0.2 or 1.0 E.U./dL   Nitrite, UA negative    Leukocytes, UA Negative Negative       Assessment and Plan: Dysuria - Plan: POCT Urinalysis Dipstick (Automated), Urine Culture  Routine screening for STI (sexually transmitted infection) - Plan: HSV(herpes simplex vrs) 1+2 ab-IgG, Hepatitis B surface antibody, Hepatitis B surface antigen, Hepatitis C antibody, RPR,  HIV antibody, Cervicovaginal ancillary only  Screening for cervical cancer - Plan: Cytology - PAP  Vaginal discomfort - Plan: Cervicovaginal ancillary only  Stable angina pectoris (HCC) - Plan: nitroGLYCERIN (NITROSTAT) 0.4 MG SL tablet  Screening examination for infectious disease - Plan: HIV antibody  Screening for HIV (human immunodeficiency virus) - Plan: HIV antibody  Here today with concern about UTI or STI.  Her UA is negative but will get a culture Also STI screening as above, pap If negative may try a topical estrogen for likely atrophy associated sx.  Pt is in agreement with this idea encouraged her to continue to work on her lifestyle and weight- she will do so Will plan further follow- up pending labs.- pt does use mychart   Signed Lamar Blinks, MD  Received her labs as below:  Results for orders placed or performed in visit on 12/16/16  Urine Culture  Result Value Ref Range   Colony Count 10,000-50,000 CFU/mL    Preliminary Report ESCHERICHIA COLI   HSV(herpes simplex vrs) 1+2 ab-IgG  Result Value Ref Range   HSV 1 Glycoprotein G Ab, IgG <0.90 <0.90 Index   HSV 2 Glycoprotein G Ab, IgG <0.90 <0.90 Index  Hepatitis B surface antibody  Result Value Ref Range   Hepatitis B-Post <5.0 mIU/mL  Hepatitis B surface antigen  Result Value Ref Range   Hepatitis B Surface Ag NEGATIVE NEGATIVE  Hepatitis C antibody  Result Value Ref Range   HCV Ab NEGATIVE NEGATIVE  RPR  Result Value Ref Range   RPR Ser Ql NON REAC NON REAC  HIV antibody  Result Value Ref Range   HIV 1&2 Ab, 4th Generation NONREACTIVE NONREACTIVE  POCT Urinalysis Dipstick (Automated)  Result Value Ref Range   Color, UA yellow  Clarity, UA clear    Glucose, UA negative    Bilirubin, UA negative    Ketones, UA negative    Spec Grav, UA 1.015 1.010 - 1.025   Blood, UA negative    pH, UA 6.0 5.0 - 8.0   Protein, UA negative    Urobilinogen, UA 0.2 0.2 or 1.0 E.U./dL   Nitrite, UA negative     Leukocytes, UA Negative Negative   She did grow a low colony count of e coli from urine.  Will treat this with keflex and

## 2016-12-16 ENCOUNTER — Other Ambulatory Visit (HOSPITAL_COMMUNITY)
Admission: RE | Admit: 2016-12-16 | Discharge: 2016-12-16 | Disposition: A | Discharge status: Home / Self Care | Payer: Medicare Other | Source: Ambulatory Visit | Attending: Family Medicine | Admitting: Family Medicine

## 2016-12-16 ENCOUNTER — Ambulatory Visit (INDEPENDENT_AMBULATORY_CARE_PROVIDER_SITE_OTHER): Payer: Medicare Other | Admitting: Family Medicine

## 2016-12-16 VITALS — BP 130/82 | HR 59 | Temp 97.7°F | Ht 65.5 in | Wt 182.6 lb

## 2016-12-16 DIAGNOSIS — Z113 Encounter for screening for infections with a predominantly sexual mode of transmission: Secondary | ICD-10-CM

## 2016-12-16 DIAGNOSIS — I208 Other forms of angina pectoris: Secondary | ICD-10-CM

## 2016-12-16 DIAGNOSIS — R300 Dysuria: Secondary | ICD-10-CM

## 2016-12-16 DIAGNOSIS — N949 Unspecified condition associated with female genital organs and menstrual cycle: Secondary | ICD-10-CM | POA: Diagnosis not present

## 2016-12-16 DIAGNOSIS — I2510 Atherosclerotic heart disease of native coronary artery without angina pectoris: Secondary | ICD-10-CM | POA: Diagnosis not present

## 2016-12-16 DIAGNOSIS — Z119 Encounter for screening for infectious and parasitic diseases, unspecified: Secondary | ICD-10-CM | POA: Insufficient documentation

## 2016-12-16 DIAGNOSIS — I2089 Other forms of angina pectoris: Secondary | ICD-10-CM

## 2016-12-16 DIAGNOSIS — Z114 Encounter for screening for human immunodeficiency virus [HIV]: Secondary | ICD-10-CM | POA: Insufficient documentation

## 2016-12-16 DIAGNOSIS — Z124 Encounter for screening for malignant neoplasm of cervix: Secondary | ICD-10-CM | POA: Insufficient documentation

## 2016-12-16 LAB — POC URINALSYSI DIPSTICK (AUTOMATED)
Bilirubin, UA: NEGATIVE
Blood, UA: NEGATIVE
Glucose, UA: NEGATIVE
KETONES UA: NEGATIVE
Leukocytes, UA: NEGATIVE
Nitrite, UA: NEGATIVE
PH UA: 6 (ref 5.0–8.0)
PROTEIN UA: NEGATIVE
SPEC GRAV UA: 1.015 (ref 1.010–1.025)
UROBILINOGEN UA: 0.2 U/dL

## 2016-12-16 MED ORDER — NITROGLYCERIN 0.4 MG SL SUBL: 0.4000 mg | SUBLINGUAL_TABLET | SUBLINGUAL | 2 refills | Status: DC | PRN

## 2016-12-16 NOTE — Patient Instructions (Signed)
Your urine looks fine today, although I will order a culture as well and be in touch with this result We will also run your pap and other labs today, and I will contact you with results asap Assuming all is well, your symptoms may be due to vaginal atrophy.  If you like, an estrogen cream may be helpful Take care!

## 2016-12-17 LAB — HEPATITIS B SURFACE ANTIGEN: Hepatitis B Surface Ag: NEGATIVE

## 2016-12-17 LAB — HIV ANTIBODY (ROUTINE TESTING W REFLEX): HIV 1&2 Ab, 4th Generation: NONREACTIVE

## 2016-12-17 LAB — HSV(HERPES SIMPLEX VRS) I + II AB-IGG

## 2016-12-17 LAB — HEPATITIS B SURFACE ANTIBODY, QUANTITATIVE: Hepatitis B-Post: <5 m[IU]/mL

## 2016-12-17 LAB — HEPATITIS C ANTIBODY: HCV Ab: NEGATIVE

## 2016-12-18 ENCOUNTER — Encounter: Payer: Self-pay | Admitting: Family Medicine

## 2016-12-18 LAB — RPR

## 2016-12-18 MED ORDER — CEPHALEXIN 500 MG PO CAPS: 500.0000 mg | ORAL_CAPSULE | Freq: Two times a day (BID) | ORAL | 0 refills | Status: DC

## 2016-12-18 NOTE — Addendum Note (Signed)
Addended by: Lamar Blinks C on: 12/18/2016 12:09 PM   Modules accepted: Orders

## 2016-12-19 LAB — CYTOLOGY - PAP: DIAGNOSIS: NEGATIVE

## 2016-12-19 LAB — URINE CULTURE

## 2016-12-19 LAB — CERVICOVAGINAL ANCILLARY ONLY
BACTERIAL VAGINITIS: NEGATIVE
Candida vaginitis: NEGATIVE
Chlamydia: NEGATIVE
NEISSERIA GONORRHEA: NEGATIVE
Trichomonas: NEGATIVE

## 2016-12-19 MED ORDER — ESTRADIOL 10 MCG VA TABS: ORAL_TABLET | VAGINAL | 11 refills | Status: DC

## 2016-12-25 ENCOUNTER — Ambulatory Visit: Payer: Medicare Other | Admitting: Cardiology

## 2017-01-18 ENCOUNTER — Encounter: Payer: Self-pay | Admitting: Family Medicine

## 2017-02-03 ENCOUNTER — Encounter: Payer: Self-pay | Admitting: Family Medicine

## 2017-02-04 MED ORDER — ESTRADIOL 10 MCG VA TABS: ORAL_TABLET | VAGINAL | 3 refills | Status: DC

## 2017-03-07 NOTE — Progress Notes (Addendum)
Subjective:   Debra Burgess is a 71 y.o. female who presents for an Initial Medicare Annual Wellness Visit.  Pt just moved here recently and really seems happy with everything.  Review of Systems    No ROS.  Medicare Wellness Visit. Additional risk factors are reflected in the social history. Cardiac Risk Factors include: advanced age (>45men, >35 women);dyslipidemia;hypertension Sleep patterns:Sleepsing well. Feels very rested.  Home Safety/Smoke Alarms: Feels safe in home. Smoke alarms in place.  Living environment; residence and Firearm Safety: Lives alone on 2nd story condo. Has elevator. Seat Belt Safety/Bike Helmet: Wears seat belt.   Female:   Pap- last 12/16/16: normal    Mammo-  Last 10/02/16:  BI-RADS CATEGORY  1: Negative.    Dexa scan- last 04/28/15: osteopenia        CCS- last 12/07/15: recall 10 yrs    Objective:    Today's Vitals   03/12/17 1105  BP: 136/79  Pulse: (!) 52  SpO2: 97%  Weight: 185 lb 6.4 oz (84.1 kg)  Height: 5\' 6"  (1.676 m)   Body mass index is 29.92 kg/m.   Current Medications (verified) Outpatient Encounter Prescriptions as of 03/12/2017  Medication Sig  . acetaminophen (TYLENOL) 325 MG tablet Take 325 mg by mouth every 6 (six) hours as needed (for headaches or body pain).  Marland Kitchen aspirin 81 MG chewable tablet Chew 81 mg by mouth daily.   . Coenzyme Q10 (CO Q-10) 100 MG CAPS Take 100 mg by mouth 2 (two) times daily. GUMMIES  . Estradiol 10 MCG TABS vaginal tablet Place 1 tablet vaginally 1-2x a week as needed to maintain vaginal comfort  . metoprolol tartrate (LOPRESSOR) 25 MG tablet Take 1 tablet (25 mg total) by mouth 2 (two) times daily.  . nitroGLYCERIN (NITROSTAT) 0.4 MG SL tablet Place 1 tablet (0.4 mg total) under the tongue every 5 (five) minutes x 3 doses as needed for chest pain.  . Omega-3 Fatty Acids (FISH OIL) 1000 MG CAPS Take 1 capsule (1,000 mg total) by mouth 2 (two) times daily. (Patient taking differently: Take 1 capsule by  mouth 2 (two) times daily. BURP-FREE GUMMIES)  . rosuvastatin (CRESTOR) 10 MG tablet Take 10 mg by mouth every other day.  . losartan (COZAAR) 50 MG tablet Take 1 tablet (50 mg total) by mouth daily. (Patient taking differently: Take 75 mg by mouth daily. )  . [DISCONTINUED] cephALEXin (KEFLEX) 500 MG capsule Take 1 capsule (500 mg total) by mouth 2 (two) times daily.   No facility-administered encounter medications on file as of 03/12/2017.     Allergies (verified) Mango flavor and Lipitor [atorvastatin]   History: Past Medical History:  Diagnosis Date  . Arthritis   . Cancer ([place])   . Diverticulosis   . Fibrocystic breast   . GERD (gastroesophageal reflux disease)   . Hiatal hernia   . History of chicken pox   . Hyperlipidemia   . Hypertension   . Mononucleosis    2/15  . MVP (mitral valve prolapse)   . Osteopenia 04/30/2015   Past Surgical History:  Procedure Laterality Date  . BREAST SURGERY Bilateral 2010   breast reduction  . CORONARY ANGIOGRAPHY  09/02/2016  . CYST EXCISION  06/2016   hyoid bone area per patient  . HIATAL HERNIA REPAIR  2015   "severe hiatal hernia repair", robotic repair with pig skin sheath  . LEFT HEART CATH AND CORONARY ANGIOGRAPHY N/A 09/02/2016   Procedure: Left Heart Cath and Coronary Angiography;  Surgeon: Leonie Man, MD;  Location: [facility];  Service: Cardiovascular;  Laterality: N/A;  . REDUCTION MAMMAPLASTY Bilateral    2011  . TONSILLECTOMY AND ADENOIDECTOMY  1952   Family History  Problem Relation Age of Onset  . Hypertension Mother   . Arthritis Mother   . Colon polyps Mother        had 2 surgeries  . Prostate cancer Father   . Arthritis Maternal Grandmother   . Diabetes Cousin        maternal  . Arthritis Sister   . Hypertension Sister   . Ovarian cancer Sister        ovarian and breast  . Hypertension Sister   . Breast cancer Sister    Social History   Occupational History  . teacher    Social  History Main Topics  . Smoking status: Never Smoker  . Smokeless tobacco: Never Used  . Alcohol use No     Comment: Rarely  . Drug use: No  . Sexual activity: No    Tobacco Counseling Counseling given: Not Answered   Activities of Daily Living In your present state of health, do you have any difficulty performing the following activities: 03/12/2017 09/02/2016  Hearing? N N  Vision? N N  Comment uses reading glasses. -  Difficulty concentrating or making decisions? N N  Walking or climbing stairs? N N  Dressing or bathing? N N  Doing errands, shopping? N N  Preparing Food and eating ? N -  Using the Toilet? N -  In the past six months, have you accidently leaked urine? N -  Do you have problems with loss of bowel control? N -  Managing your Medications? N -  Managing your Finances? N -  Housekeeping or managing your Housekeeping? N -  Some recent data might be hidden    Immunizations and Health Maintenance Immunization History  Administered Date(s) Administered  . Pneumococcal Conjugate-13 03/15/2015  . Pneumococcal Polysaccharide-23 04/19/2016  . Tdap 09/16/2014  . Zoster 06/17/2009   There are no preventive care reminders to display for this patient.  Patient Care Team: [name], NP as PCP - General (Internal Medicine)  Indicate any recent Medical Services you may have received from other than [facility] providers in the past year (date may be approximate).     Assessment:   This is a routine wellness examination for Debra Burgess. Physical assessment deferred to PCP.  Hearing/Vision screen  Visual Acuity Screening   Right eye Left eye Both eyes  Without correction: 20/20 20/20 20/20   With correction:     Hearing Screening Comments: Able to hear conversational tones w/o difficulty. No issues reported.    Dietary issues and exercise activities discussed: Current Exercise Habits: Home exercise routine, Type of exercise: walking (swimming), Time (Minutes): 60,  Frequency (Times/Week): 7, Weekly Exercise (Minutes/Week): 420, Intensity: Mild Diet (meal preparation, eat out, water intake, caffeinated beverages, dairy products, fruits and vegetables): in general, a "healthy" diet     Goals      Patient Stated   . 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 (pt-stated)      Depression Screen PHQ 2/9 Scores 03/12/2017  PHQ - 2 Score 0    Fall Risk Fall Risk  03/12/2017  Falls in the past year? No    Cognitive Function: Ad8 score reviewed for issues:  Issues making decisions:no  Less interest in hobbies / activities:no  Repeats questions, stories (family complaining):no  Trouble using ordinary gadgets (microwave, computer,  phone):no  Forgets the month or year: no  Mismanaging finances: no  Remembering appts:no  Daily problems with thinking and/or memory:no Ad8 score is=0         Screening Tests Health Maintenance  Topic Date Due  . INFLUENZA VACCINE  03/12/2018 (Originally 01/15/2017)  . MAMMOGRAM  10/02/2018  . TETANUS/TDAP  09/15/2024  . COLONOSCOPY  12/06/2025  . DEXA SCAN  Completed  . Hepatitis C Screening  Completed  . PNA vac Low Risk Adult  Completed      Plan:   Follow up with PCP as directed.  Continue to eat heart healthy diet (full of fruits, vegetables, whole grains, lean protein, water--limit salt, fat, and sugar intake) and increase physical activity as tolerated.  Continue doing brain stimulating activities (puzzles, reading, adult coloring books, staying active) to keep memory sharp.   Bring a copy of your living will and/or healthcare power of attorney to your next office visit.    I have personally reviewed and noted the following in the patient's chart:   . Medical and social history . Use of alcohol, tobacco or illicit drugs  . Current medications and supplements . Functional ability and status . Nutritional status . Physical activity . Advanced directives . List of other  physicians . Hospitalizations, surgeries, and ER visits in previous 12 months . Vitals . Screenings to include cognitive, depression, and falls . Referrals and appointments  In addition, I have reviewed and discussed with patient certain preventive protocols, quality metrics, and best practice recommendations. A written personalized care plan for preventive services as well as general preventive health recommendations were provided to patient.     Shela Nevin, [place]   03/12/2017    I have read the MWE note by Ms. Vevelyn Royals and agree with her documentation  Denny Peon MD

## 2017-03-12 ENCOUNTER — Encounter: Payer: Self-pay | Admitting: *Deleted

## 2017-03-12 ENCOUNTER — Ambulatory Visit (INDEPENDENT_AMBULATORY_CARE_PROVIDER_SITE_OTHER): Payer: Medicare Other | Admitting: *Deleted

## 2017-03-12 VITALS — BP 136/79 | HR 52 | Ht 66.0 in | Wt 185.4 lb

## 2017-03-12 DIAGNOSIS — Z0000 Encounter for general adult medical examination without abnormal findings: Secondary | ICD-10-CM

## 2017-03-12 NOTE — Patient Instructions (Signed)
Debra Burgess , Thank you for taking time to come for your Medicare Wellness Visit. I appreciate your ongoing commitment to your health goals. Please review the following plan we discussed and let me know if I can assist you in the future.   These are the goals we discussed: Goals      Patient Stated   . 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 (pt-stated)       This is a list of the screening recommended for you and due dates:  Health Maintenance  Topic Date Due  . Flu Shot  03/12/2018*  . Mammogram  10/02/2018  . Tetanus Vaccine  09/15/2024  . Colon Cancer Screening  12/06/2025  . DEXA scan (bone density measurement)  Completed  .  Hepatitis C: One time screening is recommended by Center for Disease Control  (CDC) for  adults born from [birth year] through 1963.   Completed  . Pneumonia vaccines  Completed  *Topic was postponed. The date shown is not the original due date.   Continue to eat heart healthy diet (full of fruits, vegetables, whole grains, lean protein, water--limit salt, fat, and sugar intake) and increase physical activity as tolerated.  Continue doing brain stimulating activities (puzzles, reading, adult coloring books, staying active) to keep memory sharp.   Bring a copy of your living will and/or healthcare power of attorney to your next office visit.    Health Maintenance for Postmenopausal Women Menopause is a normal process in which your reproductive ability comes to an end. This process happens gradually over a span of months to years, usually between the ages of 7 and 57. Menopause is complete when you have missed 12 consecutive menstrual periods. It is important to talk with your health care provider about some of the most common conditions that affect postmenopausal women, such as heart disease, cancer, and bone loss (osteoporosis). Adopting a healthy lifestyle and getting preventive care can help to promote your health and wellness. Those actions can also lower your chances  of developing some of these common conditions. What should I know about menopause? During menopause, you may experience a number of symptoms, such as:  Moderate-to-severe hot flashes.  Night sweats.  Decrease in sex drive.  Mood swings.  Headaches.  Tiredness.  Irritability.  Memory problems.  Insomnia.  Choosing to treat or not to treat menopausal changes is an individual decision that you make with your health care provider. What should I know about hormone replacement therapy and supplements? Hormone therapy products are effective for treating symptoms that are associated with menopause, such as hot flashes and night sweats. Hormone replacement carries certain risks, especially as you become older. If you are thinking about using estrogen or estrogen with progestin treatments, discuss the benefits and risks with your health care provider. What should I know about heart disease and stroke? Heart disease, heart attack, and stroke become more likely as you age. This may be due, in part, to the hormonal changes that your body experiences during menopause. These can affect how your body processes dietary fats, triglycerides, and cholesterol. Heart attack and stroke are both medical emergencies. There are many things that you can do to help prevent heart disease and stroke:  Have your blood pressure checked at least every 1-2 years. High blood pressure causes heart disease and increases the risk of stroke.  If you are 21-79 years old, ask your health care provider if you should take aspirin to prevent a heart attack or a stroke.  Do  not use any tobacco products, including cigarettes, chewing tobacco, or electronic cigarettes. If you need help quitting, ask your health care provider.  It is important to eat a healthy diet and maintain a healthy weight. ? Be sure to include plenty of vegetables, fruits, low-fat dairy products, and lean protein. ? Avoid eating foods that are high in  solid fats, added sugars, or salt (sodium).  Get regular exercise. This is one of the most important things that you can do for your health. ? Try to exercise for at least 150 minutes each week. The type of exercise that you do should increase your heart rate and make you sweat. This is known as moderate-intensity exercise. ? Try to do strengthening exercises at least twice each week. Do these in addition to the moderate-intensity exercise.  Know your numbers.Ask your health care provider to check your cholesterol and your blood glucose. Continue to have your blood tested as directed by your health care provider.  What should I know about cancer screening? There are several types of cancer. Take the following steps to reduce your risk and to catch any cancer development as early as possible. Breast Cancer  Practice breast self-awareness. ? This means understanding how your breasts normally appear and feel. ? It also means doing regular breast self-exams. Let your health care provider know about any changes, no matter how small.  If you are 9 or older, have a clinician do a breast exam (clinical breast exam or CBE) every year. Depending on your age, family history, and medical history, it may be recommended that you also have a yearly breast X-ray (mammogram).  If you have a family history of breast cancer, talk with your health care provider about genetic screening.  If you are at high risk for breast cancer, talk with your health care provider about having an MRI and a mammogram every year.  Breast cancer (BRCA) gene test is recommended for women who have family members with BRCA-related cancers. Results of the assessment will determine the need for genetic counseling and BRCA1 and for BRCA2 testing. BRCA-related cancers include these types: ? Breast. This occurs in males or females. ? Ovarian. ? Tubal. This may also be called fallopian tube cancer. ? Cancer of the abdominal or pelvic  lining (peritoneal cancer). ? Prostate. ? Pancreatic.  Cervical, Uterine, and Ovarian Cancer Your health care provider may recommend that you be screened regularly for cancer of the pelvic organs. These include your ovaries, uterus, and vagina. This screening involves a pelvic exam, which includes checking for microscopic changes to the surface of your cervix (Pap test).  For women ages 21-65, health care providers may recommend a pelvic exam and a Pap test every three years. For women ages 85-65, they may recommend the Pap test and pelvic exam, combined with testing for human papilloma virus (HPV), every five years. Some types of HPV increase your risk of cervical cancer. Testing for HPV may also be done on women of any age who have unclear Pap test results.  Other health care providers may not recommend any screening for nonpregnant women who are considered low risk for pelvic cancer and have no symptoms. Ask your health care provider if a screening pelvic exam is right for you.  If you have had past treatment for cervical cancer or a condition that could lead to cancer, you need Pap tests and screening for cancer for at least 20 years after your treatment. If Pap tests have been discontinued for  you, your risk factors (such as having a new sexual partner) need to be reassessed to determine if you should start having screenings again. Some women have medical problems that increase the chance of getting cervical cancer. In these cases, your health care provider may recommend that you have screening and Pap tests more often.  If you have a family history of uterine cancer or ovarian cancer, talk with your health care provider about genetic screening.  If you have vaginal bleeding after reaching menopause, tell your health care provider.  There are currently no reliable tests available to screen for ovarian cancer.  Lung Cancer Lung cancer screening is recommended for adults 86-47 years old who  are at high risk for lung cancer because of a history of smoking. A yearly low-dose CT scan of the lungs is recommended if you:  Currently smoke.  Have a history of at least 30 pack-years of smoking and you currently smoke or have quit within the past 15 years. A pack-year is smoking an average of one pack of cigarettes per day for one year.  Yearly screening should:  Continue until it has been 15 years since you quit.  Stop if you develop a health problem that would prevent you from having lung cancer treatment.  Colorectal Cancer  This type of cancer can be detected and can often be prevented.  Routine colorectal cancer screening usually begins at age 74 and continues through age 51.  If you have risk factors for colon cancer, your health care provider may recommend that you be screened at an earlier age.  If you have a family history of colorectal cancer, talk with your health care provider about genetic screening.  Your health care provider may also recommend using home test kits to check for hidden blood in your stool.  A small camera at the end of a tube can be used to examine your colon directly (sigmoidoscopy or colonoscopy). This is done to check for the earliest forms of colorectal cancer.  Direct examination of the colon should be repeated every 5-10 years until age 44. However, if early forms of precancerous polyps or small growths are found or if you have a family history or genetic risk for colorectal cancer, you may need to be screened more often.  Skin Cancer  Check your skin from head to toe regularly.  Monitor any moles. Be sure to tell your health care provider: ? About any new moles or changes in moles, especially if there is a change in a mole's shape or color. ? If you have a mole that is larger than the size of a pencil eraser.  If any of your family members has a history of skin cancer, especially at a young age, talk with your health care provider about  genetic screening.  Always use sunscreen. Apply sunscreen liberally and repeatedly throughout the day.  Whenever you are outside, protect yourself by wearing long sleeves, pants, a wide-brimmed hat, and sunglasses.  What should I know about osteoporosis? Osteoporosis is a condition in which bone destruction happens more quickly than new bone creation. After menopause, you may be at an increased risk for osteoporosis. To help prevent osteoporosis or the bone fractures that can happen because of osteoporosis, the following is recommended:  If you are 56-5 years old, get at least 1,000 mg of calcium and at least 600 mg of vitamin D per day.  If you are older than age 68 but younger than age 85, get at  least 1,200 mg of calcium and at least 600 mg of vitamin D per day.  If you are older than age 22, get at least 1,200 mg of calcium and at least 800 mg of vitamin D per day.  Smoking and excessive alcohol intake increase the risk of osteoporosis. Eat foods that are rich in calcium and vitamin D, and do weight-bearing exercises several times each week as directed by your health care provider. What should I know about how menopause affects my mental health? Depression may occur at any age, but it is more common as you become older. Common symptoms of depression include:  Low or sad mood.  Changes in sleep patterns.  Changes in appetite or eating patterns.  Feeling an overall lack of motivation or enjoyment of activities that you previously enjoyed.  Frequent crying spells.  Talk with your health care provider if you think that you are experiencing depression. What should I know about immunizations? It is important that you get and maintain your immunizations. These include:  Tetanus, diphtheria, and pertussis (Tdap) booster vaccine.  Influenza every year before the flu season begins.  Pneumonia vaccine.  Shingles vaccine.  Your health care provider may also recommend other  immunizations. This information is not intended to replace advice given to you by your health care provider. Make sure you discuss any questions you have with your health care provider. Document Released: 07/26/2005 Document Revised: 12/22/2015 Document Reviewed: 03/07/2015 Elsevier Interactive Patient Education  2018 [employer].

## 2017-05-01 ENCOUNTER — Encounter: Payer: Self-pay | Admitting: Family Medicine

## 2017-05-01 ENCOUNTER — Ambulatory Visit (INDEPENDENT_AMBULATORY_CARE_PROVIDER_SITE_OTHER): Payer: Medicare Other | Admitting: Family Medicine

## 2017-05-01 VITALS — BP 125/85 | HR 88 | Temp 97.4°F | Ht 65.5 in | Wt 187.6 lb

## 2017-05-01 DIAGNOSIS — I208 Other forms of angina pectoris: Secondary | ICD-10-CM

## 2017-05-01 DIAGNOSIS — R300 Dysuria: Secondary | ICD-10-CM | POA: Diagnosis not present

## 2017-05-01 DIAGNOSIS — I2510 Atherosclerotic heart disease of native coronary artery without angina pectoris: Secondary | ICD-10-CM

## 2017-05-01 DIAGNOSIS — R82998 Other abnormal findings in urine: Secondary | ICD-10-CM | POA: Diagnosis not present

## 2017-05-01 LAB — POC URINALSYSI DIPSTICK (AUTOMATED)
BILIRUBIN UA: NEGATIVE
GLUCOSE UA: NEGATIVE
KETONES UA: NEGATIVE
Nitrite, UA: NEGATIVE
PH UA: 6 (ref 5.0–8.0)
Protein, UA: NEGATIVE
RBC UA: NEGATIVE
Urobilinogen, UA: 0.2 E.U./dL

## 2017-05-01 MED ORDER — CIPROFLOXACIN HCL 500 MG PO TABS: 500.0000 mg | ORAL_TABLET | Freq: Two times a day (BID) | ORAL | 0 refills | Status: DC

## 2017-05-01 MED ORDER — NITROGLYCERIN 0.4 MG SL SUBL: 0.4000 mg | SUBLINGUAL_TABLET | SUBLINGUAL | 2 refills | Status: DC | PRN

## 2017-05-01 NOTE — Patient Instructions (Signed)
It was nice to see you today!  Safe travels!  I will be in touch with your urine culture asap In the meantime we are going to treat you with cipro 500 mg twice a day- we are choosing this medication as you do show possible symptoms of a kidney infection.   Rest and drink plenty of water.  If you are not feeling better- or if you start feeling worse- please seek care right away

## 2017-05-01 NOTE — Progress Notes (Signed)
[facility] [address], [place], [place] [address] [phone]  Date:  99/37/1696   Name:  Debra Burgess   DOB:  03-Nov-1945   MRN:  [id number]  PCP:  [name], NP    Chief Complaint: Dysuria (c/o painful urination, right sided back pain, chills x 4 days. Pt states that she felt like she had a fever last night. )   History of Present Illness:  Debra Burgess is a 71 y.o. very pleasant female patient who presents with the following:  Here today for a sick visit, pt of [name] I did see her for dysuria back in July of this year-  She had a low level E coli UTI which we treated with keflex   Here today with concern of possible UTI She noted some sx about 4 days ago- but she got worse the night before last.  She had chills, felt generally bad, her BP went up She notes burning with urination, and a back ache on the right side.  This is typical for her when she has a UTI.  She also feels like her belly is bloated and she notes urinary urgency  She feels like the vaginal estrogen is helping her a lot as she is more comfortable.  She is only using 10 mcg a week as this seems to be enough  She had a subjective fever 2 nights ago, better last night, and seems ok today She has not been vomiting.  But she did notice some nausea early in this illness No hematuria noted  She is able to eat ok  She is able to tolerate cipro ok- reports that she took this last year   Patient Active Problem List   Diagnosis Date Noted  . EKG, abnormal 09/02/2016  . Chest pain 09/02/2016  . Nonspecific abnormal finding in stool contents 11/28/2015  . Change in bowel habits 11/28/2015  . Gas 11/28/2015  . Bloating 11/28/2015  . Osteopenia 04/30/2015  . Palpitations 04/10/2015  . Hyperlipidemia 03/16/2015  . GERD (gastroesophageal reflux disease) 03/16/2015  . Mitral valve regurgitation 03/16/2015  . Essential hypertension  03/16/2015    Past Medical History:  Diagnosis Date  . Arthritis   . Cancer ([place])   . Diverticulosis   . Fibrocystic breast   . GERD (gastroesophageal reflux disease)   . Hiatal hernia   . History of chicken pox   . Hyperlipidemia   . Hypertension   . Mononucleosis    2/15  . MVP (mitral valve prolapse)   . Osteopenia 04/30/2015    Past Surgical History:  Procedure Laterality Date  . BREAST SURGERY Bilateral 2010   breast reduction  . CORONARY ANGIOGRAPHY  09/02/2016  . CYST EXCISION  06/2016   hyoid bone area per patient  . HIATAL HERNIA REPAIR  2015   "severe hiatal hernia repair", robotic repair with pig skin sheath  . LEFT HEART CATH AND CORONARY ANGIOGRAPHY N/A 09/02/2016   Procedure: Left Heart Cath and Coronary Angiography;  Surgeon: Leonie Man, MD;  Location: [facility];  Service: Cardiovascular;  Laterality: N/A;  . REDUCTION MAMMAPLASTY Bilateral    2011  . TONSILLECTOMY AND ADENOIDECTOMY  1952    Social History   Tobacco Use  . Smoking status: Never Smoker  . Smokeless tobacco: Never Used  Substance Use Topics  . Alcohol use: No    Alcohol/week: 0.0 oz    Comment: Rarely  .  Drug use: No    Family History  Problem Relation Age of Onset  . Hypertension Mother   . Arthritis Mother   . Colon polyps Mother        had 2 surgeries  . Prostate cancer Father   . Arthritis Maternal Grandmother   . Diabetes Cousin        maternal  . Arthritis Sister   . Hypertension Sister   . Ovarian cancer Sister        ovarian and breast  . Hypertension Sister   . Breast cancer Sister     Allergies  Allergen Reactions  . Mango Flavor Anaphylaxis  . Lipitor [Atorvastatin] Other (See Comments)    Caused neuropathy to one half of the patient's face    Medication list has been reviewed and updated.  Current Outpatient Medications on File Prior to Visit  Medication Sig Dispense Refill  . acetaminophen (TYLENOL) 325 MG tablet Take 325 mg by mouth  every 6 (six) hours as needed (for headaches or body pain).    Marland Kitchen aspirin 81 MG chewable tablet Chew 81 mg by mouth daily.     . Coenzyme Q10 (CO Q-10) 100 MG CAPS Take 100 mg by mouth 2 (two) times daily. GUMMIES    . Estradiol 10 MCG TABS vaginal tablet Place 1 tablet vaginally 1-2x a week as needed to maintain vaginal comfort 24 tablet 3  . losartan (COZAAR) 50 MG tablet Take 1 tablet (50 mg total) by mouth daily. (Patient taking differently: Take 75 mg by mouth daily. ) 90 tablet 3  . metoprolol tartrate (LOPRESSOR) 25 MG tablet Take 1 tablet (25 mg total) by mouth 2 (two) times daily. 180 tablet 3  . nitroGLYCERIN (NITROSTAT) 0.4 MG SL tablet Place 1 tablet (0.4 mg total) under the tongue every 5 (five) minutes x 3 doses as needed for chest pain. 25 tablet 2  . Omega-3 Fatty Acids (FISH OIL) 1000 MG CAPS Take 1 capsule (1,000 mg total) by mouth 2 (two) times daily. (Patient taking differently: Take 1 capsule by mouth 2 (two) times daily. BURP-FREE GUMMIES) 180 capsule 3  . rosuvastatin (CRESTOR) 10 MG tablet Take 10 mg by mouth every other day.     No current facility-administered medications on file prior to visit.     Review of Systems:  As per HPI- otherwise negative.   Physical Examination: Vitals:   05/01/17 1647  BP: (!) 162/98  Pulse: 88  Temp: (!) 97.4 F (36.3 C)  SpO2: 98%   Vitals:   05/01/17 1647  Weight: 187 lb 9.6 oz (85.1 kg)  Height: 5' 5.5" (1.664 m)   Body mass index is 30.74 kg/m. Ideal Body Weight: Weight in (lb) to have BMI = 25: 152.2  GEN: WDWN, NAD, Non-toxic, A & O x 3, overweight, otherwise looks well HEENT: Atraumatic, Normocephalic. Neck supple. No masses, No LAD.  Bilateral TM wnl, oropharynx normal.  PEERL,EOMI.   Ears and Nose: No external deformity. CV: RRR, No M/G/R. No JVD. No thrill. No extra heart sounds. PULM: CTA B, no wheezes, crackles, rhonchi. No retractions. No resp. distress. No accessory muscle use. ABD: S, NT, ND, +BS. No  rebound. No HSM. EXTR: No c/c/e NEURO Normal gait.  PSYCH: Normally interactive. Conversant. Not depressed or anxious appearing.  Calm demeanor.  No CVA tenderness Belly is benign at exam  Results for orders placed or performed in visit on 05/01/17  POCT Urinalysis Dipstick (Automated)  Result Value Ref Range  Color, UA yellow    Clarity, UA clear    Glucose, UA negative    Bilirubin, UA negaitve    Ketones, UA negative    Spec Grav, UA >=1.030 (A) 1.010 - 1.025   Blood, UA negative    pH, UA 6.0 5.0 - 8.0   Protein, UA negative    Urobilinogen, UA 0.2 0.2 or 1.0 E.U./dL   Nitrite, UA negative    Leukocytes, UA Small (1+) (A) Negative    Assessment and Plan: Dysuria - Plan: POCT Urinalysis Dipstick (Automated), ciprofloxacin (CIPRO) 500 MG tablet  Leukocytes in urine - Plan: Urine Culture  Stable angina pectoris (HCC) - Plan: nitroGLYCERIN (NITROSTAT) 0.4 MG SL tablet  Here today with concern of UTI She notes that she is having typical UTI sx for her with dysuria, back pain, and subjective fever. She is not febrile at exam, but will treat for possible pyelonephritis with cipro. Explained this choice to pt and she agrees She will seek care if not feeling better in the next few days- Sooner if worse.  I will follow-up with her culture results   Signed Lamar Blinks, MD

## 2017-05-04 ENCOUNTER — Encounter: Payer: Self-pay | Admitting: Family Medicine

## 2017-05-04 DIAGNOSIS — N3090 Cystitis, unspecified without hematuria: Secondary | ICD-10-CM

## 2017-05-04 LAB — URINE CULTURE
MICRO NUMBER: 81295453
SPECIMEN QUALITY:: ADEQUATE

## 2017-05-13 ENCOUNTER — Encounter (HOSPITAL_COMMUNITY): Payer: Self-pay

## 2017-05-13 ENCOUNTER — Emergency Department (HOSPITAL_COMMUNITY)
Admission: EM | Admit: 2017-05-13 | Discharge: 2017-05-13 | Disposition: A | Discharge status: Home / Self Care | Payer: Medicare Other | Attending: Emergency Medicine | Admitting: Emergency Medicine

## 2017-05-13 ENCOUNTER — Emergency Department (HOSPITAL_COMMUNITY): Payer: Medicare Other

## 2017-05-13 ENCOUNTER — Other Ambulatory Visit: Payer: Self-pay

## 2017-05-13 DIAGNOSIS — R079 Chest pain, unspecified: Secondary | ICD-10-CM | POA: Diagnosis not present

## 2017-05-13 DIAGNOSIS — Z79899 Other long term (current) drug therapy: Secondary | ICD-10-CM | POA: Diagnosis not present

## 2017-05-13 DIAGNOSIS — R0789 Other chest pain: Secondary | ICD-10-CM | POA: Diagnosis not present

## 2017-05-13 DIAGNOSIS — E785 Hyperlipidemia, unspecified: Secondary | ICD-10-CM | POA: Diagnosis not present

## 2017-05-13 DIAGNOSIS — Z7982 Long term (current) use of aspirin: Secondary | ICD-10-CM | POA: Diagnosis not present

## 2017-05-13 DIAGNOSIS — I208 Other forms of angina pectoris: Secondary | ICD-10-CM

## 2017-05-13 DIAGNOSIS — I10 Essential (primary) hypertension: Secondary | ICD-10-CM | POA: Diagnosis not present

## 2017-05-13 LAB — CBC
HEMATOCRIT: 40 % (ref 36.0–46.0)
Hemoglobin: 13.1 g/dL (ref 12.0–15.0)
MCH: 29.2 pg (ref 26.0–34.0)
MCHC: 32.8 g/dL (ref 30.0–36.0)
MCV: 89.3 fL (ref 78.0–100.0)
PLATELETS: 317 10*3/uL (ref 150–400)
RBC: 4.48 MIL/uL (ref 3.87–5.11)
RDW: 13 % (ref 11.5–15.5)
WBC: 10.4 10*3/uL (ref 4.0–10.5)

## 2017-05-13 LAB — BASIC METABOLIC PANEL
Anion gap: 7 (ref 5–15)
BUN: 16 mg/dL (ref 6–20)
CHLORIDE: 109 mmol/L (ref 101–111)
CO2: 23 mmol/L (ref 22–32)
CREATININE: 0.78 mg/dL (ref 0.44–1.00)
Calcium: 9.2 mg/dL (ref 8.9–10.3)
GFR calc Af Amer: >60 mL/min (ref >60)
GFR calc non Af Amer: >60 mL/min (ref >60)
Glucose, Bld: 107 mg/dL — ABNORMAL HIGH (ref 65–99)
POTASSIUM: 4.2 mmol/L (ref 3.5–5.1)
Sodium: 139 mmol/L (ref 135–145)

## 2017-05-13 LAB — SEDIMENTATION RATE: SED RATE: 25 mm/h — AB (ref 0–22)

## 2017-05-13 LAB — I-STAT TROPONIN, ED: Troponin i, poc: 0 ng/mL (ref 0.00–0.08)

## 2017-05-13 LAB — D-DIMER, QUANTITATIVE: D-Dimer, Quant: 0.43 ug/mL-FEU (ref 0.00–0.50)

## 2017-05-13 MED ORDER — METOPROLOL TARTRATE 50 MG PO TABS: 50.0000 mg | ORAL_TABLET | Freq: Two times a day (BID) | ORAL | 6 refills | Status: DC

## 2017-05-13 MED ORDER — NITROGLYCERIN 0.4 MG SL SUBL: 0.4000 mg | SUBLINGUAL_TABLET | SUBLINGUAL | 3 refills | Status: DC | PRN

## 2017-05-13 NOTE — ED Provider Notes (Signed)
[facility] EMERGENCY DEPARTMENT Provider Note   CSN: [id number] Arrival date & time: 05/13/17  3267     History   Chief Complaint Chief Complaint  Patient presents with  . Chest Pain    HPI Debra Burgess is a 71 y.o. female.  HPI  Patient with history of hypertension hyperlipidemia and coronary artery disease presents with complaint of chest pain.  She states when she woke up this morning she had a left-sided pressure type of pain in her chest.  She took nitroglycerin which helped the pain.  After starting to do some work around the house she began to have more severe chest pains in the left side.  She took a total of 3 nitroglycerin which did not immediately relieve the pain.  She describes breaking into some sweats.  No nausea and no shortness of breath associated.  No radiation of the pain.  Her initial pain was similar to prior angina, but the pain increased and was more intense than usual, prompting her to come for ED evaluation.  She is currently chest pain free in on my evaluation.  There are no other associated systemic symptoms, there are no other alleviating or modifying factors.   Past Medical History:  Diagnosis Date  . Arthritis   . Cancer ([place])   . Diverticulosis   . Fibrocystic breast   . GERD (gastroesophageal reflux disease)   . Hiatal hernia   . History of chicken pox   . Hyperlipidemia   . Hypertension   . Mononucleosis    2/15  . MVP (mitral valve prolapse)   . Osteopenia 04/30/2015    Patient Active Problem List   Diagnosis Date Noted  . EKG, abnormal 09/02/2016  . Chest pain 09/02/2016  . Nonspecific abnormal finding in stool contents 11/28/2015  . Change in bowel habits 11/28/2015  . Gas 11/28/2015  . Bloating 11/28/2015  . Osteopenia 04/30/2015  . Palpitations 04/10/2015  . Hyperlipidemia 03/16/2015  . GERD (gastroesophageal reflux disease) 03/16/2015  . Mitral valve regurgitation 03/16/2015  . Essential hypertension  03/16/2015    Past Surgical History:  Procedure Laterality Date  . BREAST SURGERY Bilateral 2010   breast reduction  . CORONARY ANGIOGRAPHY  09/02/2016  . CYST EXCISION  06/2016   hyoid bone area per patient  . HIATAL HERNIA REPAIR  2015   "severe hiatal hernia repair", robotic repair with pig skin sheath  . LEFT HEART CATH AND CORONARY ANGIOGRAPHY N/A 09/02/2016   Procedure: Left Heart Cath and Coronary Angiography;  Surgeon: Leonie Man, MD;  Location: [facility];  Service: Cardiovascular;  Laterality: N/A;  . REDUCTION MAMMAPLASTY Bilateral    2011  . TONSILLECTOMY AND ADENOIDECTOMY  1952    OB History    No data available       Home Medications    Prior to Admission medications   Medication Sig Start Date End Date Taking? Authorizing Provider  acetaminophen (TYLENOL) 325 MG tablet Take 325 mg by mouth every 6 (six) hours as needed (for headaches or body pain).   Yes [provider]  aspirin 81 MG tablet Chew 81 mg by mouth daily.    Yes [provider]  Coenzyme Q10 (CO Q-10) 100 MG CAPS Take 100 mg by mouth 2 (two) times daily. GUMMIES   Yes [provider]  Estradiol 10 MCG TABS vaginal tablet Place 1 tablet vaginally 1-2x a week as needed to maintain vaginal comfort 02/04/17  Yes Copland, Gay Filler,  MD  losartan (COZAAR) 50 MG tablet Take 1 tablet (50 mg total) by mouth daily. Patient taking differently: Take 75 mg by mouth daily.  11/19/16 05/13/17 Yes Meng, Isaac Laud, PA  Omega-3 Fatty Acids (FISH OIL) 1000 MG CAPS Take 1 capsule (1,000 mg total) by mouth 2 (two) times daily. Patient taking differently: Take 1 capsule by mouth 2 (two) times daily. BURP-FREE GUMMIES 04/10/15  Yes Lelon Perla, MD  ciprofloxacin (CIPRO) 500 MG tablet Take 1 tablet (500 mg total) 2 (two) times daily by mouth. Patient not taking: Reported on 05/13/2017 05/01/17   Copland, Gay Filler, MD  metoprolol tartrate (LOPRESSOR) 50 MG tablet Take 1 tablet (50 mg  total) by mouth 2 (two) times daily. Pt has been instructed to titrate up to 100 mg BID over the next 2-3 days. 05/13/17   Ark Agrusa, Forbes Cellar, MD  nitroGLYCERIN (NITROSTAT) 0.4 MG SL tablet Place 1 tablet (0.4 mg total) under the tongue every 5 (five) minutes x 3 doses as needed for chest pain. 05/13/17   Krue Peterka, Forbes Cellar, MD    Family History Family History  Problem Relation Age of Onset  . Hypertension Mother   . Arthritis Mother   . Colon polyps Mother        had 2 surgeries  . Prostate cancer Father   . Arthritis Maternal Grandmother   . Diabetes Cousin        maternal  . Arthritis Sister   . Hypertension Sister   . Ovarian cancer Sister        ovarian and breast  . Hypertension Sister   . Breast cancer Sister     Social History Social History   Tobacco Use  . Smoking status: Never Smoker  . Smokeless tobacco: Never Used  Substance Use Topics  . Alcohol use: No    Alcohol/week: 0.0 oz    Comment: Rarely  . Drug use: No     Allergies   Mango flavor and Lipitor [atorvastatin]   Review of Systems Review of Systems  ROS reviewed and all otherwise negative except for mentioned in HPI   Physical Exam Updated Vital Signs BP (!) 145/85   Pulse (!) 56   Temp 98.3 F (36.8 C) (Oral)   Resp 19   Ht '5\' 5"'  (1.651 m)   Wt 83.9 kg (185 lb)   LMP 10/15/1997   SpO2 97%   BMI 30.79 kg/m  Vitals reviewed Physical Exam  Physical Examination: General appearance - alert, well appearing, and in no distress Mental status - alert, oriented to person, place, and time Eyes - no conjunctival injection, no scleral icterus Mouth - mucous membranes moist, pharynx normal without lesions Neck - supple, no significant adenopathy Chest - clear to auscultation, no wheezes, rales or rhonchi, symmetric air entry Heart - normal rate, regular rhythm, normal S1, S2, no murmurs, rubs, clicks or gallops Abdomen - soft, nontender, nondistended, no masses or organomegaly Neurological - alert,  oriented, normal speech Extremities - peripheral pulses normal, no pedal edema, no clubbing or cyanosis Skin - normal coloration and turgor, no rashes   ED Treatments / Results  Labs (all labs ordered are listed, but only abnormal results are displayed) Labs Reviewed  BASIC METABOLIC PANEL - Abnormal; Notable for the following components:      Result Value   Glucose, Bld 107 (*)    All other components within normal limits  CBC  D-DIMER, QUANTITATIVE (NOT AT [facility])  SEDIMENTATION RATE  I-STAT TROPONIN, ED  EKG  EKG Interpretation  Date/Time:  Tuesday May 13 2017 10:03:52 EST Ventricular Rate:  77 PR Interval:  164 QRS Duration: 76 QT Interval:  374 QTC Calculation: 423 R Axis:   40 Text Interpretation:  Normal sinus rhythm Nonspecific ST abnormality No significant change since last tracing Confirmed by Lajean Saver [phone]) on 05/13/2017 10:08:04 AM       Radiology Dg Chest 2 View  Result Date: 05/13/2017 CLINICAL DATA:  Chest pain, stiff neck EXAM: CHEST  2 VIEW COMPARISON:  09/02/2016 FINDINGS: Heart and mediastinal contours are within normal limits. No focal opacities or effusions. No acute bony abnormality. IMPRESSION: No active cardiopulmonary disease. Electronically Signed   By: Rolm Baptise M.D.   On: 05/13/2017 10:34    Procedures Procedures (including critical care time)  Medications Ordered in ED Medications - No data to display   Initial Impression / Assessment and Plan / ED Course  I have reviewed the triage vital signs and the nursing notes.  Pertinent labs & imaging results that were available during my care of the patient were reviewed by me and considered in my medical decision making (see chart for details).   pt with a heart score of 4  12:40 PM  D/w cardiology and they will consult on the patient.  She is currently chest pain free  3:52 PM cardiology has seen patient and have added an ESR and d-dimer.  They state that if these labs are  negative then she can be discharged.  They have recommended gradual increase in her metoprolol dosing as well as advised a refill of her nitroglycerin.  Final Clinical Impressions(s) / ED Diagnoses   Final diagnoses:  Nonspecific chest pain    ED Discharge Orders        Ordered    metoprolol tartrate (LOPRESSOR) 50 MG tablet  2 times daily,   Status:  Discontinued     05/13/17 1512    nitroGLYCERIN (NITROSTAT) 0.4 MG SL tablet  Every 5 min x3 PRN,   Status:  Discontinued     05/13/17 1512    metoprolol tartrate (LOPRESSOR) 50 MG tablet  2 times daily     05/13/17 1520    nitroGLYCERIN (NITROSTAT) 0.4 MG SL tablet  Every 5 min x3 PRN     05/13/17 1520       Caya Soberanis, Forbes Cellar, MD 05/13/17 1703

## 2017-05-13 NOTE — ED Notes (Signed)
Pt states she understands instructions. Home stable with steady gait. With daughter.

## 2017-05-13 NOTE — ED Notes (Signed)
Walked patient to the bathroom patient did fine patient is back in bed resting with call bell in reach

## 2017-05-13 NOTE — ED Notes (Signed)
Cards at bedside

## 2017-05-13 NOTE — ED Triage Notes (Signed)
Patient complains of chest tightness that awoke her this am 0500. Denies any associated symptoms. Took 3 baby asa and 3 SL ntg with some relief. Patient alert and oriented, NAD

## 2017-05-13 NOTE — Discharge Instructions (Signed)
Return to the ED with any concerns including worsening chest pain, shortness of breath, fainting, leg swelling, decreased level of alertness/lethargy, or any other alarming symptoms

## 2017-05-13 NOTE — Consult Note (Signed)
Cardiology Consultation:   Patient ID: Debra Burgess; [id number]; Jun 19, 1945   Admit date: 05/13/2017 Date of Consult: 05/13/2017  Primary Care Provider: Darreld Mclean, MD Primary Cardiologist: Dr. Stanford Breed Primary Electrophysiologist:     Patient Profile:   Debra Burgess is a 71 y.o. female with a hx of HTN, HLD, intolerance to statins, hiatal hernia, MVP, and nonobstructive CAD who is being seen today for the evaluation of chest pain at the request of Debra Burgess.   History of Present Illness:   [name] is known to this service and last saw Debra Burgess in clinic on 09/25/16. This was a follow up to a recent hospitalization 08/2016 for chest pain. At that hospitalization, she underwent cardiac catheterization 09/02/16 that showed 35% proximal LAD, 45% mid left circumflex, 60% lateral ramus branch lesion, and EF greater than 65%.  No culprit lesion was identified to explain the patient's symptoms or EKG changes.  However it was noted that there was a potential site for spasm in the mid left circumflex.  She was discharged on 09/03/2016, but return to the ER on 09/18/2016 with abdominal pain and fever.  She was treated for a UTI, CT abdomen and pelvis showed hernias, no other acute processes.  At her clinic follow up with Debra Burgess, it was noted that she had stopped the 5 mg crestor for abdominal complaints that she attributes to crestor. She was generally in her usual state of health. She reported taking one SL nitro for chest pain that relieved her pain. She had MSK back pain, but no further chest pain reported at that visit.   On 05/12/17, she reported to [facility] for left-sided chest pain unrelieved with 3 SL nitros. The first occurrence was at approximately 0400 and woke her from sleep.  She took 1 nitro and a 81 mg ASA and went back to sleep. She woke up later this morning and started doing house chores. She had a recurrence of chest pain and took another SL nitro x 2 and another 81 mg ASA without  relief. Because she had taken a total of 3 SL nitro without resolution, she reported to [facility]. She states the chest pain was rated as a 4/10 at its worst, described as a sharp stabbing pain, and is intermittent. She is currently chest pain free. She denies shortness of breath, palpitations, lower extremity swelling, calf pain, dizziness, and feelings of syncope. She occasionally gets lightheaded, but this is not necessarily associated with chest pain. She reports becoming diaphoretic with the chest pain.   Of note, she was recently treated with cipro for a UTI (last dose 4 days ago). Her chest pain is not positional. She also traveled on a driving trip with several extended legs (9-14 hrs of driving x 3 legs). She denies calf pain and states she stopped taking her supplemental estrogen and took extra ASA during the trip to avoid a blood clot.   She has been taking SL nitro nearly weekly since her heart cath. She is on her second refill and reports taking it 4-6 times a month for chest pain. The nitro always relieves her chest pain with one administration, until today.   Past Medical History:  Diagnosis Date  . Arthritis   . Cancer ([place])   . Diverticulosis   . Fibrocystic breast   . GERD (gastroesophageal reflux disease)   . Hiatal hernia   . History of chicken pox   . Hyperlipidemia   . Hypertension   . Mononucleosis  2/15  . MVP (mitral valve prolapse)   . Osteopenia 04/30/2015    Past Surgical History:  Procedure Laterality Date  . BREAST SURGERY Bilateral 2010   breast reduction  . CORONARY ANGIOGRAPHY  09/02/2016  . CYST EXCISION  06/2016   hyoid bone area per patient  . HIATAL HERNIA REPAIR  2015   "severe hiatal hernia repair", robotic repair with pig skin sheath  . LEFT HEART CATH AND CORONARY ANGIOGRAPHY N/A 09/02/2016   Procedure: Left Heart Cath and Coronary Angiography;  Surgeon: Leonie Man, MD;  Location: [facility];  Service: Cardiovascular;  Laterality:  N/A;  . REDUCTION MAMMAPLASTY Bilateral    2011  . TONSILLECTOMY AND ADENOIDECTOMY  1952     Home Medications:  Prior to Admission medications   Medication Sig Start Date End Date Taking? Authorizing Provider  acetaminophen (TYLENOL) 325 MG tablet Take 325 mg by mouth every 6 (six) hours as needed (for headaches or body pain).   Yes [provider]  aspirin 81 MG tablet Chew 81 mg by mouth daily.    Yes [provider]  Coenzyme Q10 (CO Q-10) 100 MG CAPS Take 100 mg by mouth 2 (two) times daily. GUMMIES   Yes [provider]  Estradiol 10 MCG TABS vaginal tablet Place 1 tablet vaginally 1-2x a week as needed to maintain vaginal comfort 02/04/17  Yes Copland, Gay Filler, MD  losartan (COZAAR) 50 MG tablet Take 1 tablet (50 mg total) by mouth daily. Patient taking differently: Take 75 mg by mouth daily.  11/19/16 05/13/17 Yes Debra Deforest, PA  metoprolol tartrate (LOPRESSOR) 25 MG tablet Take 1 tablet (25 mg total) by mouth 2 (two) times daily. 09/03/16  Yes Kilroy, Luke K, PA-C  nitroGLYCERIN (NITROSTAT) 0.4 MG SL tablet Place 1 tablet (0.4 mg total) every 5 (five) minutes x 3 doses as needed under the tongue for chest pain. 05/01/17  Yes Copland, Gay Filler, MD  Omega-3 Fatty Acids (FISH OIL) 1000 MG CAPS Take 1 capsule (1,000 mg total) by mouth 2 (two) times daily. Patient taking differently: Take 1 capsule by mouth 2 (two) times daily. BURP-FREE GUMMIES 04/10/15  Yes Lelon Perla, MD  rosuvastatin (CRESTOR) 10 MG tablet Take 10 mg by mouth every other day.   Yes [provider]  ciprofloxacin (CIPRO) 500 MG tablet Take 1 tablet (500 mg total) 2 (two) times daily by mouth. Patient not taking: Reported on 05/13/2017 05/01/17   Copland, Gay Filler, MD    Inpatient Medications: Scheduled Meds:  Continuous Infusions:  PRN Meds:   Allergies:    Allergies  Allergen Reactions  . Mango Flavor Anaphylaxis  . Lipitor [Atorvastatin] Other (See Comments)     Caused neuropathy to one half of the patient's face    Social History:   Social History   Socioeconomic History  . Marital status: Single    Spouse name: Not on file  . Number of children: 4  . Years of education: Not on file  . Highest education level: Not on file  Social Needs  . Financial resource strain: Not on file  . Food insecurity - worry: Not on file  . Food insecurity - inability: Not on file  . Transportation needs - medical: Not on file  . Transportation needs - non-medical: Not on file  Occupational History  . Occupation: Pharmacist, hospital  Tobacco Use  . Smoking status: Never Smoker  . Smokeless tobacco: Never Used  Substance and Sexual Activity  .  Alcohol use: No    Alcohol/week: 0.0 oz    Comment: Rarely  . Drug use: No  . Sexual activity: No  Other Topics Concern  . Not on file  Social History Narrative   Recently moved to be near her daughter Mosetta Putt.     Has 4 children   Teaches latin Youth worker) Technology, STEM (middle school) Writer at [employer]   Completed masters degree   Divorced     Family History:    Family History  Problem Relation Age of Onset  . Hypertension Mother   . Arthritis Mother   . Colon polyps Mother        had 2 surgeries  . Prostate cancer Father   . Arthritis Maternal Grandmother   . Diabetes Cousin        maternal  . Arthritis Sister   . Hypertension Sister   . Ovarian cancer Sister        ovarian and breast  . Hypertension Sister   . Breast cancer Sister      ROS:  Please see the history of present illness.  ROS  All other ROS reviewed and negative.     Physical Exam/Data:   Vitals:   05/13/17 1115 05/13/17 1200 05/13/17 1245 05/13/17 1300  BP: 129/78 118/72 138/75 120/77  Pulse: 63 61 66 (!) 54  Resp: 14 (!) '21 15 15  ' Temp:      TempSrc:      SpO2: 99% 98% 99% 100%  Weight:      Height:       No intake or output data in the 24 hours ending 05/13/17 1338 Filed Weights    05/13/17 1005  Weight: 185 lb (83.9 kg)   Body mass index is 30.79 kg/m.  General:  Well nourished, well developed, in no acute distress HEENT: normal Neck: no JVD Vascular: No carotid bruits Cardiac:  normal S1, S2; RRR; no murmur  Lungs:  clear to auscultation bilaterally, no wheezing, rhonchi or rales  Abd: soft, nontender, no hepatomegaly  Ext: no edema Musculoskeletal:  No deformities, BUE and BLE strength normal and equal Skin: warm and dry  Neuro:  CNs 2-12 intact, no focal abnormalities noted Psych:  Normal affect   EKG:  The EKG was personally reviewed and demonstrates:  sinus Telemetry:  Telemetry was personally reviewed and demonstrates:  sinus  Relevant CV Studies:  LHC 09/02/16: Prox LAD lesion, 35 %stenosed. Non-flow-limiting  Mid Cx lesion, 45 %stenosed. Potential site of spasm  Lat Ramus branch lesion, 60 %stenosed. Small-caliber vessel very tortuous. Not PCI target  There is hyperdynamic left ventricular systolic function.  The left ventricular ejection fraction is greater than 65% by visual estimate.  LV end diastolic pressure is normal.  Angiographically no culprit lesion to explain the patient's symptoms and abnormal EKG.  Cannot exclude coronary artery spasm.  Plan:  Transfer to 6 central post procedure unit for TR band removal  We will check d-dimer and amylase/lipase to exclude pancreatic concerns and PE  We'll start low-dose amlodipine for possible coronary vasospasm.   Myoview 01/11/16:  Nuclear stress EF: 65%. No wall motion abnormalities  There was no ST segment deviation noted during stress. Exercise time fair - 6 minutes and 30 seconds with appropriate blood pressure response. Rare PAC  Defect 1: There is a small defect of mild severity present in the mid anteroseptal location. This is likely representative of breast attenuation artifact  This is a low risk study.  Laboratory Data:  Chemistry Recent Labs  Lab 05/13/17 0959  NA 139    K 4.2  CL 109  CO2 23  GLUCOSE 107*  BUN 16  CREATININE 0.78  CALCIUM 9.2  GFRNONAA >60  GFRAA >60  ANIONGAP 7    No results for input(s): PROT, ALBUMIN, AST, ALT, ALKPHOS, BILITOT in the last 168 hours. Hematology Recent Labs  Lab 05/13/17 0959  WBC 10.4  RBC 4.48  HGB 13.1  HCT 40.0  MCV 89.3  MCH 29.2  MCHC 32.8  RDW 13.0  PLT 317   Cardiac EnzymesNo results for input(s): TROPONINI in the last 168 hours.  Recent Labs  Lab 05/13/17 1010  TROPIPOC 0.00    BNPNo results for input(s): BNP, PROBNP in the last 168 hours.  DDimer No results for input(s): DDIMER in the last 168 hours.  Radiology/Studies:  Dg Chest 2 View  Result Date: 05/13/2017 CLINICAL DATA:  Chest pain, stiff neck EXAM: CHEST  2 VIEW COMPARISON:  09/02/2016 FINDINGS: Heart and mediastinal contours are within normal limits. No focal opacities or effusions. No acute bony abnormality. IMPRESSION: No active cardiopulmonary disease. Electronically Signed   By: Rolm Baptise M.D.   On: 05/13/2017 10:34    Assessment and Plan:   1. Chest pain Troponin: 0.00 --> < 0.03 --> 0.03 - EKG without acute ischemia - CXR without acute cardiopulmonary findings She had a recent heart cath 08/2016 with nonobstructive CAD, but possible area susceptible to vasospasm. Her chest pain has been responsive to SL nitro until today. If she is having vasospasms, may consider a Ca channel blocker such as norvasc. Given her recent illness (UTI) and antibiotic use, pericarditis is in the differential. Although this is less likely given that her pain is not positional and she does not have EKG changes. Could check a sed rate. She also has risk factors for PE/DVT given her recent travel and chronic estrogen use. Would recommend checking a D-dimer.   Will titrate lopressor to 100 mg BID (give 50 mg tablets) and refill nitro SL.  2. HTN - continue losartan and lopressor  3. HLD - no longer taking crestor - consider referral for  repatha    For questions or updates, please contact [place] Please consult [email or web address] for contact info under Cardiology/STEMI.   Signed, Ledora Bottcher, [place]  05/13/2017 1:38 PM  Attending Note:   The patient was seen and examined.  Agree with assessment and plan as noted above.  Changes made to the above note as needed.  Patient seen and independently examined with Doreene Adas, PA .   We discussed all aspects of the encounter. I agree with the assessment and plan as stated above.  1.  Chest pain :  I have presonally reviewed her cath.  She has mild  - mod CAD . im not convenced that this is spasm Her LV gram shows mid systolic dynamic obstruction She is on metoprolol 50 bid and comments that she felt better once this was added. I think she will do better on a higher dose of metoprolol Will increase to 75 mg BID for several days and then increase to 100 BID  Follow up with [place] in several weeks  Check d-dimer ESR     I have spent a total of 40 minutes with patient reviewing hospital  notes , telemetry, EKGs, labs and examining patient as well as establishing an assessment and plan that was discussed with the patient. > 50% of time was  spent in direct patient care.    Thayer Headings, Brooke Bonito., MD, [facility] 05/13/2017, 3:13 PM [address]  [place] Pager [phone]

## 2017-06-12 ENCOUNTER — Encounter: Payer: Self-pay | Admitting: *Deleted

## 2017-06-20 ENCOUNTER — Encounter: Payer: Self-pay | Admitting: Physician Assistant

## 2017-06-20 ENCOUNTER — Ambulatory Visit (INDEPENDENT_AMBULATORY_CARE_PROVIDER_SITE_OTHER): Payer: Medicare Other | Admitting: Physician Assistant

## 2017-06-20 VITALS — BP 124/82 | HR 60 | Ht 65.5 in | Wt 186.2 lb

## 2017-06-20 DIAGNOSIS — I2510 Atherosclerotic heart disease of native coronary artery without angina pectoris: Secondary | ICD-10-CM | POA: Diagnosis not present

## 2017-06-20 DIAGNOSIS — E785 Hyperlipidemia, unspecified: Secondary | ICD-10-CM | POA: Diagnosis not present

## 2017-06-20 DIAGNOSIS — I10 Essential (primary) hypertension: Secondary | ICD-10-CM

## 2017-06-20 NOTE — Progress Notes (Signed)
Cardiology Office Note    Date:  08/20/5730   ID:  Debra Burgess, DOB [birth year], MRN [id number]  PCP:  Darreld Mclean, MD  Cardiologist:  Dr. Stanford Breed   Chief Complaint  Patient presents with  . Follow-up    seen for Dr. Stanford Breed    History of Present Illness:  Debra Burgess is a 72 y.o. female with PMH of HTN, HLD intolerant to statin, hiatal hernia, MVP but no prior history of CAD presented to the hospital on 09/02/2016 for evaluation of chest pain. EKG showed new T wave inversion in the inferior leads. Cardiac catheterization performed on 09/02/2016 showed 35% proximal LAD, 45% mid left circumflex which is a potential site for spasm, 60% lateral ramus branch lesion, EF greater than 65%. No culprit lesion was identified to explain the patient's symptom or EKG changes. However cannot rule out coronary artery spasm. D-dimer was negative on 09/02/2016. Dr. Stanford Breed was able to convince her to start on Crestor 5 mg daily as her triglyceride was 167 and her LDL is 89.   He returned to the ED in April for abdominal pain and fever and that tested positive for UTI and prescribed Keflex.  Urine culture however showed less than 10,000 colonies which was insignificant growth.  CT of abdomen and pelvis in April showed a small hiatal hernia, small ventral hernia to the left of midline above the umbilicus, otherwise no acute finding.  More recently, she presented to the ED on 05/13/2017 for recurrent chest pain unrelieved after 3 nitroglycerin.  Four days prior to ED arrival, she was treated with Cipro for UTI again. She was seen by Dr. Acie Fredrickson, her metoprolol was increased to 75 mg twice daily with instructions of increasing to 100 mg twice daily after a few days.  She was eventually discharged from the ED.  D-dimer was negative.  Point-of-care troponin was also negative as well.  Patient presents today for cardiology office visit.  She has been doing well without any further chest discomfort.  She has  been continued on 75 mg twice a day of metoprolol, she has not had any issues.  She never increased her metoprolol to 100 mg twice a day, sometimes her heart rate goes to the low 50s, however she has no symptoms associated with this.  She has no lower extremity edema, orthopnea or PND.  Given the atypical nature of the chest pain, no further workup is recommended at this time.  She was also recently placed on 20 mg daily of Lipitor, we plan to check FLP and LFT. Unfortunately, she is not fasting today.    Past Medical History:  Diagnosis Date  . Arthritis   . Cancer ([place])   . Diverticulosis   . Fibrocystic breast   . GERD (gastroesophageal reflux disease)   . Hiatal hernia   . History of chicken pox   . Hyperlipidemia   . Hypertension   . Mononucleosis    2/15  . MVP (mitral valve prolapse)   . Osteopenia 04/30/2015    Past Surgical History:  Procedure Laterality Date  . BREAST SURGERY Bilateral 2010   breast reduction  . CORONARY ANGIOGRAPHY  09/02/2016  . CYST EXCISION  06/2016   hyoid bone area per patient  . HIATAL HERNIA REPAIR  2015   "severe hiatal hernia repair", robotic repair with pig skin sheath  . LEFT HEART CATH AND CORONARY ANGIOGRAPHY N/A 09/02/2016   Procedure: Left Heart Cath and Coronary Angiography;  Surgeon: Leonie Man,  MD;  Location: [facility];  Service: Cardiovascular;  Laterality: N/A;  . REDUCTION MAMMAPLASTY Bilateral    2011  . TONSILLECTOMY AND ADENOIDECTOMY  1952    Current Medications: Outpatient Medications Prior to Visit  Medication Sig Dispense Refill  . acetaminophen (TYLENOL) 325 MG tablet Take 325 mg by mouth every 6 (six) hours as needed (for headaches or body pain).    Marland Kitchen aspirin 81 MG tablet Chew 81 mg by mouth daily.     Marland Kitchen atorvastatin (LIPITOR) 20 MG tablet Take 20 mg by mouth daily at 6 PM.    . Coenzyme Q10 (CO Q-10) 100 MG CAPS Take 100 mg by mouth 2 (two) times daily. GUMMIES    . Estradiol 10 MCG TABS vaginal tablet  Place 1 tablet vaginally 1-2x a week as needed to maintain vaginal comfort 24 tablet 3  . losartan (COZAAR) 50 MG tablet Take 75 mg by mouth daily.    . metoprolol tartrate (LOPRESSOR) 50 MG tablet Take 75 mg by mouth 2 (two) times daily.    . nitroGLYCERIN (NITROSTAT) 0.4 MG SL tablet Place 1 tablet (0.4 mg total) under the tongue every 5 (five) minutes x 3 doses as needed for chest pain. 25 tablet 3  . Omega-3 Fatty Acids (FISH OIL) 1000 MG CAPS Take 1 capsule (1,000 mg total) by mouth 2 (two) times daily. (Patient taking differently: Take 1 capsule by mouth 2 (two) times daily. BURP-FREE GUMMIES) 180 capsule 3  . metoprolol tartrate (LOPRESSOR) 50 MG tablet Take 1 tablet (50 mg total) by mouth 2 (two) times daily. Pt has been instructed to titrate up to 100 mg BID over the next 2-3 days. 120 tablet 6  . ciprofloxacin (CIPRO) 500 MG tablet Take 1 tablet (500 mg total) 2 (two) times daily by mouth. (Patient not taking: Reported on 05/13/2017) 14 tablet 0  . losartan (COZAAR) 50 MG tablet Take 1 tablet (50 mg total) by mouth daily. (Patient taking differently: Take 75 mg by mouth daily. ) 90 tablet 3   No facility-administered medications prior to visit.      Allergies:   Mango flavor and Lipitor [atorvastatin]   Social History   Socioeconomic History  . Marital status: Single    Spouse name: None  . Number of children: 4  . Years of education: None  . Highest education level: None  Social Needs  . Financial resource strain: None  . Food insecurity - worry: None  . Food insecurity - inability: None  . Transportation needs - medical: None  . Transportation needs - non-medical: None  Occupational History  . Occupation: Pharmacist, hospital  Tobacco Use  . Smoking status: Never Smoker  . Smokeless tobacco: Never Used  Substance and Sexual Activity  . Alcohol use: No    Alcohol/week: 0.0 oz    Comment: Rarely  . Drug use: No  . Sexual activity: No  Other Topics Concern  . None  Social History  Narrative   Recently moved to be near her daughter Debra Burgess.     Has 4 children   Teaches latin Youth worker) Technology, STEM (middle school) Writer at [employer]   Completed masters degree   Divorced      Family History:  The patient's family history includes Arthritis in her maternal grandmother, mother, and sister; Breast cancer in her sister; Colon polyps in her mother; Diabetes in her cousin; Hypertension in her mother, sister, and sister; Ovarian cancer in her sister; Prostate cancer in  her father.   ROS:   Please see the history of present illness.    ROS All other systems reviewed and are negative.   PHYSICAL EXAM:   VS:  BP 124/82   Pulse 60   Ht 5' 5.5" (1.664 m)   Wt 186 lb 3.2 oz (84.5 kg)   LMP 10/15/1997   BMI 30.51 kg/m    GEN: Well nourished, well developed, in no acute distress  HEENT: normal  Neck: no JVD, carotid bruits, or masses Cardiac: RRR; no murmurs, rubs, or gallops,no edema  Respiratory:  clear to auscultation bilaterally, normal work of breathing GI: soft, nontender, nondistended, + BS MS: no deformity or atrophy  Skin: warm and dry, no rash Neuro:  Alert and Oriented x 3, Strength and sensation are intact Psych: euthymic mood, full affect  Wt Readings from Last 3 Encounters:  06/20/17 186 lb 3.2 oz (84.5 kg)  05/13/17 185 lb (83.9 kg)  05/01/17 187 lb 9.6 oz (85.1 kg)      Studies/Labs Reviewed:   EKG:  EKG is not ordered today.   Recent Labs: 09/18/2016: ALT 24 05/13/2017: BUN 16; Creatinine, Ser 0.78; Hemoglobin 13.1; Platelets 317; Potassium 4.2; Sodium 139   Lipid Panel    Component Value Date/Time   CHOL 169 09/03/2016 0210   TRIG 167 (H) 09/03/2016 0210   HDL 47 09/03/2016 0210   CHOLHDL 3.6 09/03/2016 0210   VLDL 33 09/03/2016 0210   LDLCALC 89 09/03/2016 0210    Additional studies/ records that were reviewed today include:   Cath 09/02/2016 Conclusion     Prox LAD lesion, 35 %stenosed.  Non-flow-limiting  Mid Cx lesion, 45 %stenosed. Potential site of spasm  Lat Ramus branch lesion, 60 %stenosed. Small-caliber vessel very tortuous. Not PCI target  There is hyperdynamic left ventricular systolic function.  The left ventricular ejection fraction is greater than 65% by visual estimate.  LV end diastolic pressure is normal.   Angiographically no culprit lesion to explain the patient's symptoms and abnormal EKG.  Cannot exclude coronary artery spasm.       ASSESSMENT:    1. Coronary artery disease involving native coronary artery of native heart without angina pectoris   2. Hyperlipidemia, unspecified hyperlipidemia type   3. Essential hypertension      PLAN:  In order of problems listed above:  1. CAD: Recently went to the ED with chest pain, troponin and d-dimer were negative.  No further ischemic workup necessary  2. Hypertension: Blood pressure stable.  Metoprolol recently increased to 75 mg twice daily.  3. Hyperlipidemia: On Lipitor 20 mg daily.  Will need fasting lipid panel and LFT.    Medication Adjustments/Labs and Tests Ordered: Current medicines are reviewed at length with the patient today.  Concerns regarding medicines are outlined above.  Medication changes, Labs and Tests ordered today are listed in the Patient Instructions below. Patient Instructions  Medication Instructions:  Continue current medications  If you need a refill on your cardiac medications before your next appointment, please call your pharmacy.  Labwork: Fasting Lipid Liver HERE IN OUR OFFICE AT [facility]  Take the provided lab slips for you to take with you to the lab for you blood draw.   You will need to fast. DO NOT EAT OR DRINK PAST MIDNIGHT.   You may go to any [facility] lab that is convenient for you however, we do have a lab in our office that is able to assist you. You do NOT need an  appointment for our lab. Once in our office lobby there is a podium to the right  of the check-in desk where you are to sign-in and ring a doorbell to alert [place] you are here. Lab is open Monday-Friday from 8:00am to 4:00pm; and is closed for lunch from 12:45p-1:45pm   Testing/Procedures: None Ordered  Special Instructions:  Happy New Year!!  Follow-Up: Your physician wants you to follow-up in: 6-9 Months with Dr Stanford Breed. You should receive a reminder letter in the mail two months in advance. If you do not receive a letter, please call our office [phone].    Thank you for choosing [facility] at [employer], [place]  06/22/2017 9:18 AM    [address], [place], [place]  [address] Phone: [phone]; Fax: [phone]

## 2017-06-20 NOTE — Patient Instructions (Signed)
Medication Instructions:  Continue current medications  If you need a refill on your cardiac medications before your next appointment, please call your pharmacy.  Labwork: Fasting Lipid Liver HERE IN OUR OFFICE AT [facility]  Take the provided lab slips for you to take with you to the lab for you blood draw.   You will need to fast. DO NOT EAT OR DRINK PAST MIDNIGHT.   You may go to any [facility] lab that is convenient for you however, we do have a lab in our office that is able to assist you. You do NOT need an appointment for our lab. Once in our office lobby there is a podium to the right of the check-in desk where you are to sign-in and ring a doorbell to alert [place] you are here. Lab is open Monday-Friday from 8:00am to 4:00pm; and is closed for lunch from 12:45p-1:45pm   Testing/Procedures: None Ordered  Special Instructions:  Happy New Year!!  Follow-Up: Your physician wants you to follow-up in: 6-9 Months with Dr Stanford Breed. You should receive a reminder letter in the mail two months in advance. If you do not receive a letter, please call our office [phone].    Thank you for choosing [facility] at [facility]!!

## 2017-06-22 ENCOUNTER — Encounter: Payer: Self-pay | Admitting: Physician Assistant

## 2017-07-01 DIAGNOSIS — I10 Essential (primary) hypertension: Secondary | ICD-10-CM | POA: Diagnosis not present

## 2017-07-01 DIAGNOSIS — E785 Hyperlipidemia, unspecified: Secondary | ICD-10-CM | POA: Diagnosis not present

## 2017-07-01 LAB — LIPID PANEL
Chol/HDL Ratio: 2.9 ratio (ref 0.0–4.4)
Cholesterol, Total: 154 mg/dL (ref 100–199)
HDL: 54 mg/dL (ref >39)
LDL Calculated: 61 mg/dL (ref 0–99)
TRIGLYCERIDES: 195 mg/dL — AB (ref 0–149)
VLDL CHOLESTEROL CAL: 39 mg/dL (ref 5–40)

## 2017-07-01 LAB — HEPATIC FUNCTION PANEL
ALT: 17 IU/L (ref 0–32)
AST: 15 IU/L (ref 0–40)
Albumin: 4.2 g/dL (ref 3.6–4.8)
Alkaline Phosphatase: 108 IU/L (ref 39–117)
Bilirubin Total: 0.2 mg/dL (ref 0.0–1.2)
Bilirubin, Direct: 0.08 mg/dL (ref 0.00–0.40)
Total Protein: 6.9 g/dL (ref 6.0–8.5)

## 2017-07-18 ENCOUNTER — Other Ambulatory Visit: Payer: Self-pay | Admitting: Physician Assistant

## 2017-07-18 NOTE — Telephone Encounter (Signed)
REFILL 

## 2017-07-18 NOTE — Telephone Encounter (Signed)
Refill Request.  

## 2017-07-22 NOTE — Progress Notes (Addendum)
[place] at [facility] [address], [place], [place] [address] [phone]  Date:  01/17/5052   Name:  Debra Burgess   DOB:  1945-10-29   MRN:  [id number]  PCP:  Darreld Mclean, MD    Chief Complaint: Back Pain (c/o severe lower back pain that radiates to lower right abdomen and thigh. Pt states sx's started 7 days. Denies any burning, no increased urgency or frequency. )   History of Present Illness:  Debra Burgess is a 72 y.o. very pleasant female patient who presents with the following:  I last saw her in November.  From recent cardiology visit 1. CAD: Recently went to the ED with chest pain, troponin and d-dimer were negative.  No further ischemic workup necessary 2. Hypertension: Blood pressure stable.  Metoprolol recently increased to 75 mg twice daily. 3. Hyperlipidemia: On Lipitor 20 mg daily.  Will need fasting lipid panel and LFT.  Here today with concern of right lower quadrant pain that radiates to her right flank First noted it about 6 days ago- thought she had pulled a muscle She had a fever at home this past Friday (today is Wednesday)- she had sweats, took her temp and it was up to 99.2 max. At its worst the pain radiated to her upper right thigh The pain is now significantly better but still present  Her appendix has NOT been removed She also still has her ovaries She had a lot of ovarian cysts when she was younger.  Always on her right side  She felt pretty sick to her stomach when she had the fever- this is now improved but not 100% well No vomiting Mild diarrhea She does have history of diverticulitis and takes miralax once a week or so to keep her bowels moving No dysuria No urinary urgency No hematuria- however she notes that she often does not have any of these sx with UTI Never had a kidney stone per her knowledge  Patient Active Problem List   Diagnosis Date Noted  . EKG, abnormal 09/02/2016   . Chest pain 09/02/2016  . Nonspecific abnormal finding in stool contents 11/28/2015  . Change in bowel habits 11/28/2015  . Gas 11/28/2015  . Bloating 11/28/2015  . Osteopenia 04/30/2015  . Palpitations 04/10/2015  . Hyperlipidemia 03/16/2015  . GERD (gastroesophageal reflux disease) 03/16/2015  . Mitral valve regurgitation 03/16/2015  . Essential hypertension 03/16/2015    Past Medical History:  Diagnosis Date  . Arthritis   . Cancer ([place])   . Diverticulosis   . Fibrocystic breast   . GERD (gastroesophageal reflux disease)   . Hiatal hernia   . History of chicken pox   . Hyperlipidemia   . Hypertension   . Mononucleosis    2/15  . MVP (mitral valve prolapse)   . Osteopenia 04/30/2015    Past Surgical History:  Procedure Laterality Date  . BREAST SURGERY Bilateral 2010   breast reduction  . CORONARY ANGIOGRAPHY  09/02/2016  . CYST EXCISION  06/2016   hyoid bone area per patient  . HIATAL HERNIA REPAIR  2015   "severe hiatal hernia repair", robotic repair with pig skin sheath  . LEFT HEART CATH AND CORONARY ANGIOGRAPHY N/A 09/02/2016   Procedure: Left Heart Cath and Coronary Angiography;  Surgeon: Leonie Man, MD;  Location: [facility];  Service: Cardiovascular;  Laterality: N/A;  . REDUCTION MAMMAPLASTY Bilateral    2011  . TONSILLECTOMY  AND ADENOIDECTOMY  1952    Social History   Tobacco Use  . Smoking status: Never Smoker  . Smokeless tobacco: Never Used  Substance Use Topics  . Alcohol use: No    Alcohol/week: 0.0 oz    Comment: Rarely  . Drug use: No    Family History  Problem Relation Age of Onset  . Hypertension Mother   . Arthritis Mother   . Colon polyps Mother        had 2 surgeries  . Prostate cancer Father   . Arthritis Maternal Grandmother   . Diabetes Cousin        maternal  . Arthritis Sister   . Hypertension Sister   . Ovarian cancer Sister        ovarian and breast  . Hypertension Sister   . Breast cancer Sister      Allergies  Allergen Reactions  . Mango Flavor Anaphylaxis    Had reaction to the fruit.  . Lipitor [Atorvastatin] Other (See Comments)    Caused neuropathy to one half of the patient's face    Medication list has been reviewed and updated.  Current Outpatient Medications on File Prior to Visit  Medication Sig Dispense Refill  . acetaminophen (TYLENOL) 325 MG tablet Take 325 mg by mouth every 6 (six) hours as needed (for headaches or body pain).    Marland Kitchen aspirin 81 MG tablet Chew 81 mg by mouth daily.     Marland Kitchen atorvastatin (LIPITOR) 20 MG tablet Take 20 mg by mouth daily at 6 PM.    . Coenzyme Q10 (CO Q-10) 100 MG CAPS Take 100 mg by mouth 2 (two) times daily. GUMMIES    . Estradiol 10 MCG TABS vaginal tablet Place 1 tablet vaginally 1-2x a week as needed to maintain vaginal comfort 24 tablet 3  . losartan (COZAAR) 50 MG tablet TAKE ONE TABLET BY MOUTH DAILY 90 tablet 3  . metoprolol tartrate (LOPRESSOR) 50 MG tablet Take 75 mg by mouth 2 (two) times daily.    . nitroGLYCERIN (NITROSTAT) 0.4 MG SL tablet Place 1 tablet (0.4 mg total) under the tongue every 5 (five) minutes x 3 doses as needed for chest pain. 25 tablet 3  . Omega-3 Fatty Acids (FISH OIL) 1000 MG CAPS Take 1 capsule (1,000 mg total) by mouth 2 (two) times daily. (Patient taking differently: Take 1 capsule by mouth 2 (two) times daily. BURP-FREE GUMMIES) 180 capsule 3   No current facility-administered medications on file prior to visit.     Review of Systems:  As per HPI- otherwise negative. No vaginal symptoms    Physical Examination: Vitals:   07/23/17 0923  BP: 124/86  Pulse: 65  Temp: 97.8 F (36.6 C)  SpO2: 98%   Vitals:   07/23/17 0923  Weight: 190 lb 6.4 oz (86.4 kg)  Height: 5' 5.5" (1.664 m)   Body mass index is 31.2 kg/m. Ideal Body Weight: Weight in (lb) to have BMI = 25: 152.2  GEN: WDWN, NAD, Non-toxic, A & O x 3, obese, looks well  HEENT: Atraumatic, Normocephalic. Neck supple. No masses,  No LAD. Bilateral TM wnl, oropharynx normal.  PEERL,EOMI.   Ears and Nose: No external deformity. CV: RRR, No M/G/R. No JVD. No thrill. No extra heart sounds. PULM: CTA B, no wheezes, crackles, rhonchi. No retractions. No resp. distress. No accessory muscle use. ABD: S,  ND, +BS. No rebound. No HSM. She does have mild RLQ tenderness  McBurney's point EXTR: No c/c/e  NEURO Normal gait.  PSYCH: Normally interactive. Conversant. Not depressed or anxious appearing.  Calm demeanor.   Results for orders placed or performed in visit on 07/23/17  CBC  Result Value Ref Range   WBC 11.9 (H) 4.0 - 10.5 K/uL   RBC 4.61 3.87 - 5.11 Mil/uL   Platelets 325.0 150.0 - 400.0 K/uL   Hemoglobin 13.3 12.0 - 15.0 g/dL   HCT 40.6 36.0 - 46.0 %   MCV 88.1 78.0 - 100.0 fl   MCHC 32.7 30.0 - 36.0 g/dL   RDW 13.5 11.5 - 15.5 %  Comprehensive metabolic panel  Result Value Ref Range   Sodium 138 135 - 145 mEq/L   Potassium 4.2 3.5 - 5.1 mEq/L   Chloride 105 96 - 112 mEq/L   CO2 27 19 - 32 mEq/L   Glucose, Bld 108 (H) 70 - 99 mg/dL   BUN 18 6 - 23 mg/dL   Creatinine, Ser 0.80 0.40 - 1.20 mg/dL   Total Bilirubin 0.2 0.2 - 1.2 mg/dL   Alkaline Phosphatase 95 39 - 117 U/L   AST 20 0 - 37 U/L   ALT 22 0 - 35 U/L   Total Protein 7.6 6.0 - 8.3 g/dL   Albumin 4.1 3.5 - 5.2 g/dL   Calcium 9.7 8.4 - 10.5 mg/dL   GFR 75.52 >60.00 mL/min  POCT Urinalysis Dipstick (Automated)  Result Value Ref Range   Color, UA yellow    Clarity, UA clear    Glucose, UA negative    Bilirubin, UA negative    Ketones, UA negative    Spec Grav, UA >=1.030 (A) 1.010 - 1.025   Blood, UA negative    pH, UA 6.0 5.0 - 8.0   Protein, UA negative    Urobilinogen, UA 0.2 0.2 or 1.0 E.U./dL   Nitrite, UA negative    Leukocytes, UA Negative Negative     Assessment and Plan: Right lower quadrant abdominal pain - Plan: POCT Urinalysis Dipstick (Automated), CT Abdomen Pelvis W Contrast, CBC, Comprehensive metabolic panel  Discussed  concern about smoldering appendicitis with pt  We had arranged for stat CT today at 3pm to allow for her renal function to come back and for her to drink contrast contrast- however pt then let us know that she cannot do the CT this afternoon as she has to pick up her grand-daughter  Called pt at [facility]- let her know that she does have mild leukocytosis. She is scheduled to be scanned at [facility] her that to be most conservative I would advise her to have the CT today and that delay could be dangerous. However, if she is not able to do this today will plan to do tomorrow am. She will go to the ER if anything changes or gets worse over night   Signed Lamar Blinks, MD  addnd 6pm- called [facility] - they are able to do the CT tonight.  [place] [facility] with this info, encouraged her to do this evening if at all possible Spoke with her later in the evening- she plans to go ahead and stick with plan for scan in the am, she feels comfortable with this  addnd 2/7- received her CT report:  Ct Abdomen Pelvis W Contrast  Result Date: 07/24/2017 CLINICAL DATA:  Acute right lower quadrant abdominal pain. EXAM: CT ABDOMEN AND PELVIS WITH CONTRAST TECHNIQUE: Multidetector CT imaging of the abdomen and pelvis was performed using the standard protocol following bolus administration of intravenous  contrast. CONTRAST:  138mL ISOVUE-300 IOPAMIDOL (ISOVUE-300) INJECTION 61% COMPARISON:  CT scan of September 19, 2016. FINDINGS: Lower chest: No acute abnormality. Hepatobiliary: No focal liver abnormality is seen. No gallstones, gallbladder wall thickening, or biliary dilatation. Pancreas: Unremarkable. No pancreatic ductal dilatation or surrounding inflammatory changes. Spleen: Normal in size without focal abnormality. Adrenals/Urinary Tract: Adrenal glands are unremarkable. Kidneys are normal, without renal calculi, focal lesion, or hydronephrosis. Bladder is unremarkable. Stomach/Bowel: Stomach is within normal  limits. Appendix appears normal. No evidence of bowel wall thickening, distention, or inflammatory changes. Sigmoid diverticulosis is noted without inflammation. Vascular/Lymphatic: No significant vascular findings are present. No enlarged abdominal or pelvic lymph nodes. Reproductive: Uterus and bilateral adnexa are unremarkable. Other: No abdominal wall hernia or abnormality. No abdominopelvic ascites. Musculoskeletal: No acute or significant osseous findings. IMPRESSION: Sigmoid diverticulosis is noted without inflammation. No other abnormality seen in the abdomen or pelvis. Electronically Signed   By: Marijo Conception, M.D.   On: 07/24/2017 09:50   She actually went to the ER last night, but left as the wait was so long, it would not have gotten her scan done any sooner. Her leukocytosis was a bit worse, but otherwise urine and labs ok  We don't think she ended up getting a urine culture- she was not able to give [place] enough urine to send for culture  No explanation for her pain.  Mayra relates that she has previously had UTI sx like this, and that cipro has generally helped her.  She has a long awaited trip to [place] coming up and is very worried that she will be sick  Decided to have her take cipro for 5 days, and then hold another 5 pills to take with her.   I will also refer her to GYN to do further eval of her ovaries

## 2017-07-23 ENCOUNTER — Ambulatory Visit (INDEPENDENT_AMBULATORY_CARE_PROVIDER_SITE_OTHER): Payer: Medicare Other | Admitting: Family Medicine

## 2017-07-23 ENCOUNTER — Encounter: Payer: Self-pay | Admitting: Family Medicine

## 2017-07-23 DIAGNOSIS — I2510 Atherosclerotic heart disease of native coronary artery without angina pectoris: Secondary | ICD-10-CM

## 2017-07-23 DIAGNOSIS — R1031 Right lower quadrant pain: Secondary | ICD-10-CM | POA: Diagnosis not present

## 2017-07-23 LAB — POC URINALSYSI DIPSTICK (AUTOMATED)
BILIRUBIN UA: NEGATIVE
Blood, UA: NEGATIVE
GLUCOSE UA: NEGATIVE
Ketones, UA: NEGATIVE
Leukocytes, UA: NEGATIVE
NITRITE UA: NEGATIVE
Protein, UA: NEGATIVE
Spec Grav, UA: >=1.03 — AB (ref 1.010–1.025)
Urobilinogen, UA: 0.2 E.U./dL
pH, UA: 6 (ref 5.0–8.0)

## 2017-07-23 LAB — COMPREHENSIVE METABOLIC PANEL
ALBUMIN: 4.1 g/dL (ref 3.5–5.2)
ALT: 22 U/L (ref 0–35)
AST: 20 U/L (ref 0–37)
Alkaline Phosphatase: 95 U/L (ref 39–117)
BUN: 18 mg/dL (ref 6–23)
CALCIUM: 9.7 mg/dL (ref 8.4–10.5)
CHLORIDE: 105 meq/L (ref 96–112)
CO2: 27 mEq/L (ref 19–32)
CREATININE: 0.8 mg/dL (ref 0.40–1.20)
GFR: 75.52 mL/min (ref >60.00)
Glucose, Bld: 108 mg/dL — ABNORMAL HIGH (ref 70–99)
Potassium: 4.2 mEq/L (ref 3.5–5.1)
Sodium: 138 mEq/L (ref 135–145)
Total Bilirubin: 0.2 mg/dL (ref 0.2–1.2)
Total Protein: 7.6 g/dL (ref 6.0–8.3)

## 2017-07-23 LAB — CBC
HCT: 40.6 % (ref 36.0–46.0)
HEMOGLOBIN: 13.3 g/dL (ref 12.0–15.0)
MCHC: 32.7 g/dL (ref 30.0–36.0)
MCV: 88.1 fl (ref 78.0–100.0)
PLATELETS: 325 10*3/uL (ref 150.0–400.0)
RBC: 4.61 Mil/uL (ref 3.87–5.11)
RDW: 13.5 % (ref 11.5–15.5)
WBC: 11.9 10*3/uL — ABNORMAL HIGH (ref 4.0–10.5)

## 2017-07-24 ENCOUNTER — Ambulatory Visit (HOSPITAL_BASED_OUTPATIENT_CLINIC_OR_DEPARTMENT_OTHER)
Admission: RE | Admit: 2017-07-24 | Discharge: 2017-07-24 | Disposition: A | Discharge status: Home / Self Care | Payer: Medicare Other | Source: Ambulatory Visit | Attending: Family Medicine | Admitting: Family Medicine

## 2017-07-24 ENCOUNTER — Other Ambulatory Visit: Payer: Self-pay

## 2017-07-24 ENCOUNTER — Encounter (HOSPITAL_BASED_OUTPATIENT_CLINIC_OR_DEPARTMENT_OTHER): Payer: Self-pay

## 2017-07-24 ENCOUNTER — Emergency Department (HOSPITAL_COMMUNITY)
Admission: EM | Admit: 2017-07-24 | Discharge: 2017-07-24 | Disposition: A | Discharge status: Home / Self Care | Payer: Medicare Other | Attending: Emergency Medicine | Admitting: Emergency Medicine

## 2017-07-24 ENCOUNTER — Encounter: Payer: Self-pay | Admitting: Family Medicine

## 2017-07-24 DIAGNOSIS — K5730 Diverticulosis of large intestine without perforation or abscess without bleeding: Secondary | ICD-10-CM | POA: Diagnosis not present

## 2017-07-24 DIAGNOSIS — R1031 Right lower quadrant pain: Secondary | ICD-10-CM | POA: Insufficient documentation

## 2017-07-24 DIAGNOSIS — Z5321 Procedure and treatment not carried out due to patient leaving prior to being seen by health care provider: Secondary | ICD-10-CM | POA: Insufficient documentation

## 2017-07-24 LAB — COMPREHENSIVE METABOLIC PANEL
ALBUMIN: 3.8 g/dL (ref 3.5–5.0)
ALK PHOS: 94 U/L (ref 38–126)
ALT: 24 U/L (ref 14–54)
AST: 27 U/L (ref 15–41)
Anion gap: 14 (ref 5–15)
BUN: 19 mg/dL (ref 6–20)
CALCIUM: 9.6 mg/dL (ref 8.9–10.3)
CO2: 21 mmol/L — AB (ref 22–32)
CREATININE: 0.93 mg/dL (ref 0.44–1.00)
Chloride: 104 mmol/L (ref 101–111)
GFR calc non Af Amer: >60 mL/min (ref >60)
GLUCOSE: 118 mg/dL — AB (ref 65–99)
Potassium: 4.1 mmol/L (ref 3.5–5.1)
Sodium: 139 mmol/L (ref 135–145)
Total Bilirubin: 0.3 mg/dL (ref 0.3–1.2)
Total Protein: 7.2 g/dL (ref 6.5–8.1)

## 2017-07-24 LAB — URINALYSIS, ROUTINE W REFLEX MICROSCOPIC
BILIRUBIN URINE: NEGATIVE
GLUCOSE, UA: NEGATIVE mg/dL
HGB URINE DIPSTICK: NEGATIVE
Ketones, ur: NEGATIVE mg/dL
Leukocytes, UA: NEGATIVE
Nitrite: NEGATIVE
PROTEIN: NEGATIVE mg/dL
Specific Gravity, Urine: 1.016 (ref 1.005–1.030)
pH: 5 (ref 5.0–8.0)

## 2017-07-24 LAB — CBC
HCT: 39.8 % (ref 36.0–46.0)
Hemoglobin: 13.1 g/dL (ref 12.0–15.0)
MCH: 29.6 pg (ref 26.0–34.0)
MCHC: 32.9 g/dL (ref 30.0–36.0)
MCV: 89.8 fL (ref 78.0–100.0)
PLATELETS: 338 10*3/uL (ref 150–400)
RBC: 4.43 MIL/uL (ref 3.87–5.11)
RDW: 13.2 % (ref 11.5–15.5)
WBC: 14.8 10*3/uL — ABNORMAL HIGH (ref 4.0–10.5)

## 2017-07-24 LAB — LIPASE, BLOOD: Lipase: 29 U/L (ref 11–51)

## 2017-07-24 MED ORDER — IOPAMIDOL (ISOVUE-300) INJECTION 61%
100.0000 mL | Freq: Once | INTRAVENOUS | Status: AC | PRN
  Administered 2017-07-24: 100 mL via INTRAVENOUS

## 2017-07-24 MED ORDER — ONDANSETRON 4 MG PO TBDP
4.0000 mg | ORAL_TABLET | Freq: Once | ORAL | Status: AC | PRN
  Administered 2017-07-24: 4 mg via ORAL
  Filled 2017-07-24: qty 1

## 2017-07-24 MED ORDER — CIPROFLOXACIN HCL 250 MG PO TABS: ORAL_TABLET | ORAL | 0 refills | Status: DC

## 2017-07-24 NOTE — ED Notes (Signed)
Pt states she is leaving because she has appt at [facility] in 2 hours.

## 2017-07-24 NOTE — ED Triage Notes (Signed)
Pt reports R lower quadrent abdominal pain radiating to back and R lg. Seen by PCP and scheduled for ct scan in the morning, came tonight because pain got worse and started vomiting.

## 2017-07-24 NOTE — Addendum Note (Signed)
Addended by: Lamar Blinks C on: 07/24/2017 12:27 PM   Modules accepted: Orders

## 2017-07-25 NOTE — ED Notes (Signed)
Follow-up call completed.  Pt. Very grateful for call.

## 2017-08-11 ENCOUNTER — Emergency Department (HOSPITAL_COMMUNITY): Payer: Medicare Other

## 2017-08-11 ENCOUNTER — Other Ambulatory Visit: Payer: Self-pay

## 2017-08-11 ENCOUNTER — Encounter (HOSPITAL_COMMUNITY): Payer: Self-pay

## 2017-08-11 ENCOUNTER — Emergency Department (HOSPITAL_COMMUNITY)
Admission: EM | Admit: 2017-08-11 | Discharge: 2017-08-11 | Disposition: A | Discharge status: Home / Self Care | Payer: Medicare Other | Attending: Emergency Medicine | Admitting: Emergency Medicine

## 2017-08-11 DIAGNOSIS — Y999 Unspecified external cause status: Secondary | ICD-10-CM | POA: Insufficient documentation

## 2017-08-11 DIAGNOSIS — Y929 Unspecified place or not applicable: Secondary | ICD-10-CM | POA: Diagnosis not present

## 2017-08-11 DIAGNOSIS — Z7982 Long term (current) use of aspirin: Secondary | ICD-10-CM | POA: Diagnosis not present

## 2017-08-11 DIAGNOSIS — S42201A Unspecified fracture of upper end of right humerus, initial encounter for closed fracture: Secondary | ICD-10-CM | POA: Diagnosis not present

## 2017-08-11 DIAGNOSIS — S80211A Abrasion, right knee, initial encounter: Secondary | ICD-10-CM | POA: Diagnosis not present

## 2017-08-11 DIAGNOSIS — Z23 Encounter for immunization: Secondary | ICD-10-CM | POA: Insufficient documentation

## 2017-08-11 DIAGNOSIS — Y9301 Activity, walking, marching and hiking: Secondary | ICD-10-CM | POA: Insufficient documentation

## 2017-08-11 DIAGNOSIS — Z79899 Other long term (current) drug therapy: Secondary | ICD-10-CM | POA: Diagnosis not present

## 2017-08-11 DIAGNOSIS — W010XXA Fall on same level from slipping, tripping and stumbling without subsequent striking against object, initial encounter: Secondary | ICD-10-CM | POA: Diagnosis not present

## 2017-08-11 DIAGNOSIS — S4991XA Unspecified injury of right shoulder and upper arm, initial encounter: Secondary | ICD-10-CM | POA: Diagnosis present

## 2017-08-11 DIAGNOSIS — S42294A Other nondisplaced fracture of upper end of right humerus, initial encounter for closed fracture: Secondary | ICD-10-CM | POA: Diagnosis not present

## 2017-08-11 DIAGNOSIS — I10 Essential (primary) hypertension: Secondary | ICD-10-CM | POA: Diagnosis not present

## 2017-08-11 DIAGNOSIS — S42251A Displaced fracture of greater tuberosity of right humerus, initial encounter for closed fracture: Secondary | ICD-10-CM | POA: Diagnosis not present

## 2017-08-11 MED ORDER — HYDROCODONE-ACETAMINOPHEN 5-325 MG PO TABS: 2.0000 | ORAL_TABLET | Freq: Four times a day (QID) | ORAL | 0 refills | Status: DC | PRN

## 2017-08-11 MED ORDER — DOCUSATE SODIUM 100 MG PO CAPS: 100.0000 mg | ORAL_CAPSULE | Freq: Two times a day (BID) | ORAL | 0 refills | Status: AC

## 2017-08-11 MED ORDER — ONDANSETRON 4 MG PO TBDP: 4.0000 mg | ORAL_TABLET | Freq: Four times a day (QID) | ORAL | 0 refills | Status: DC | PRN

## 2017-08-11 MED ORDER — ONDANSETRON 4 MG PO TBDP
4.0000 mg | ORAL_TABLET | Freq: Once | ORAL | Status: AC
  Administered 2017-08-11: 4 mg via ORAL
  Filled 2017-08-11: qty 1

## 2017-08-11 MED ORDER — HYDROCODONE-ACETAMINOPHEN 5-325 MG PO TABS
2.0000 | ORAL_TABLET | Freq: Once | ORAL | Status: AC
  Administered 2017-08-11: 2 via ORAL
  Filled 2017-08-11: qty 2

## 2017-08-11 MED ORDER — TETANUS-DIPHTH-ACELL PERTUSSIS 5-2.5-18.5 LF-MCG/0.5 IM SUSP
0.5000 mL | Freq: Once | INTRAMUSCULAR | Status: AC
  Administered 2017-08-11: 0.5 mL via INTRAMUSCULAR
  Filled 2017-08-11: qty 0.5

## 2017-08-11 NOTE — ED Triage Notes (Signed)
Pt states that her dog tripped her and she fell onto her R side, denies hitting head or LOC, abrasion to R knee and having pain in the r humerus area.

## 2017-08-11 NOTE — ED Provider Notes (Signed)
TIME SEEN: 1:53 AM  CHIEF COMPLAINT: Fall  HPI: Patient is a 72 year old female with history of hypertension, hyperlipidemia who presents to the emergency department after a fall.  Fall occurred just prior to arrival.  States she tripped over her dog and fell on her right side.  Abrasions to the second and third right toes, right knee and fell mostly onto her right arm.  Pain in the proximal humerus.  She is right hand dominant.  Denies head injury.  No loss of consciousness.  No neck or back pain.  No known does not have an orthopedic physician.  Unsure of her last tetanus vaccination.  ROS: See HPI Constitutional: no fever  Eyes: no drainage  ENT: no runny nose   Cardiovascular:  no chest pain  Resp: no SOB  GI: no vomiting GU: no dysuria Integumentary: no rash  Allergy: no hives  Musculoskeletal: no leg swelling  Neurological: no slurred speech ROS otherwise negative  PAST MEDICAL HISTORY/PAST SURGICAL HISTORY:  Past Medical History:  Diagnosis Date  . Arthritis   . Cancer ([place])   . Diverticulosis   . Fibrocystic breast   . GERD (gastroesophageal reflux disease)   . Hiatal hernia   . History of chicken pox   . Hyperlipidemia   . Hypertension   . Mononucleosis    2/15  . MVP (mitral valve prolapse)   . Osteopenia 04/30/2015    MEDICATIONS:  Prior to Admission medications   Medication Sig Start Date End Date Taking? Authorizing Provider  acetaminophen (TYLENOL) 325 MG tablet Take 325 mg by mouth every 6 (six) hours as needed (for headaches or body pain).    [provider]  aspirin 81 MG tablet Chew 81 mg by mouth daily.     [provider]  atorvastatin (LIPITOR) 20 MG tablet Take 20 mg by mouth daily at 6 PM.    [provider]  ciprofloxacin (CIPRO) 250 MG tablet Take 1 pill daily for 5 days, hold other 5 pills for upcoming travel 07/24/17   Copland, Gay Filler, MD  Coenzyme Q10 (CO Q-10) 100 MG CAPS Take 100 mg by mouth 2 (two) times daily.  GUMMIES    [provider]  Estradiol 10 MCG TABS vaginal tablet Place 1 tablet vaginally 1-2x a week as needed to maintain vaginal comfort 02/04/17   Copland, Gay Filler, MD  losartan (COZAAR) 50 MG tablet TAKE ONE TABLET BY MOUTH DAILY 07/18/17   [name], PA  metoprolol tartrate (LOPRESSOR) 50 MG tablet Take 75 mg by mouth 2 (two) times daily.    [provider]  nitroGLYCERIN (NITROSTAT) 0.4 MG SL tablet Place 1 tablet (0.4 mg total) under the tongue every 5 (five) minutes x 3 doses as needed for chest pain. 05/13/17   MabeForbes Cellar, MD  Omega-3 Fatty Acids (FISH OIL) 1000 MG CAPS Take 1 capsule (1,000 mg total) by mouth 2 (two) times daily. Patient taking differently: Take 1 capsule by mouth 2 (two) times daily. BURP-FREE GUMMIES 04/10/15   Lelon Perla, MD    ALLERGIES:  Allergies  Allergen Reactions  . Mango Flavor Anaphylaxis    Had reaction to the fruit.  . Lipitor [Atorvastatin] Other (See Comments)    Caused neuropathy to one half of the patient's face    SOCIAL HISTORY:  Social History   Tobacco Use  . Smoking status: Never Smoker  . Smokeless tobacco: Never Used  Substance Use Topics  . Alcohol use: No  Alcohol/week: 0.0 oz    Comment: Rarely    FAMILY HISTORY: Family History  Problem Relation Age of Onset  . Hypertension Mother   . Arthritis Mother   . Colon polyps Mother        had 2 surgeries  . Prostate cancer Father   . Arthritis Maternal Grandmother   . Diabetes Cousin        maternal  . Arthritis Sister   . Hypertension Sister   . Ovarian cancer Sister        ovarian and breast  . Hypertension Sister   . Breast cancer Sister     EXAM: BP (!) 154/87 (BP Location: Left Arm)   Pulse 82   Temp (!) 97.4 F (36.3 C) (Oral)   Resp 18   LMP 10/15/1997   SpO2 97%  CONSTITUTIONAL: Alert and oriented and responds appropriately to questions. Well-appearing; well-nourished; GCS 15 HEAD: Normocephalic; atraumatic EYES:  Conjunctivae clear, PERRL, EOMI ENT: normal nose; no rhinorrhea; moist mucous membranes; pharynx without lesions noted; no dental injury; no septal hematoma NECK: Supple, no meningismus, no LAD; no midline spinal tenderness, step-off or deformity; trachea midline CARD: RRR; S1 and S2 appreciated; no murmurs, no clicks, no rubs, no gallops RESP: Normal chest excursion without splinting or tachypnea; breath sounds clear and equal bilaterally; no wheezes, no rhonchi, no rales; no hypoxia or respiratory distress CHEST:  chest wall stable, no crepitus or ecchymosis or deformity, nontender to palpation; no flail chest ABD/GI: Normal bowel sounds; non-distended; soft, non-tender, no rebound, no guarding; no ecchymosis or other lesions noted PELVIS:  stable, nontender to palpation BACK:  The back appears normal and is non-tender to palpation, there is no CVA tenderness; no midline spinal tenderness, step-off or deformity EXT: Patient has tenderness over the proximal right humerus.  Unable to move the shoulder much secondary to pain.  Normal range of motion in the right elbow chest pain and wrist and hand.  2+ radial pulses bilaterally.  2+ DP pulses bilaterally.  No bony tenderness anywhere else throughout the right arm.  She has a large abrasion over the anterior right knee but no bony tenderness and full range of motion in this joint and abrasion to the second and third dorsal right toes.  Compartments are all soft.  Otherwise extremities are nontender to palpation. SKIN: Normal color for age and race; warm NEURO: Moves all extremities equally intact, normal speech PSYCH: The patient's mood and manner are appropriate. Grooming and personal hygiene are appropriate.  MEDICAL DECISION MAKING: Patient here with mechanical fall.  Has a comminuted right humeral neck fracture.  Will place her in a sling.  She is neurovascularly intact distally.  We have cleaned the wound to her right knee sterile dressing.  Given  Vicodin for pain control here.  Will give outpatient orthopedic follow-up.  Have updated her tetanus vaccination.  Discussed supportive care instructions and return precautions.  Patient comfortable with this plan.  No other sign of trauma on exam.  No head injury.  At this time, I do not feel there is any life-threatening condition present. I have reviewed and discussed all results (EKG, imaging, lab, urine as appropriate) and exam findings with patient/family. I have reviewed nursing notes and appropriate previous records.  I feel the patient is safe to be discharged home without further emergent workup and can continue workup as an outpatient as needed. Discussed usual and customary return precautions. Patient/family verbalize understanding and are comfortable with this plan.  Outpatient follow-up  has been provided if needed. All questions have been answered.      Shelie Lansing, Delice Bison, DO 08/11/17 [phone]

## 2017-08-11 NOTE — Progress Notes (Signed)
Orthopedic Tech Progress Note Patient Details:  Debra Burgess [birth year] [id number]  Ortho Devices Type of Ortho Device: Sling immobilizer Ortho Device/Splint Location: rue Ortho Device/Splint Interventions: Ordered, Application, Adjustment   Post Interventions Patient Tolerated: Well Instructions Provided: Care of device, Adjustment of device   [name] 08/11/2017, 2:39 AM

## 2017-08-16 ENCOUNTER — Encounter: Payer: Self-pay | Admitting: Family Medicine

## 2017-08-16 ENCOUNTER — Encounter: Payer: Self-pay | Admitting: Physician Assistant

## 2017-08-19 DIAGNOSIS — S42251A Displaced fracture of greater tuberosity of right humerus, initial encounter for closed fracture: Secondary | ICD-10-CM | POA: Diagnosis not present

## 2017-08-19 NOTE — Telephone Encounter (Signed)
Given the losartan recall, please change the medication to lisinopril 10mg  daily. Discontinue losartan

## 2017-08-25 ENCOUNTER — Encounter: Payer: Medicare Other | Admitting: Obstetrics & Gynecology

## 2017-08-29 ENCOUNTER — Other Ambulatory Visit: Payer: Self-pay

## 2017-08-29 MED ORDER — LISINOPRIL 10 MG PO TABS: 10.0000 mg | ORAL_TABLET | Freq: Every day | ORAL | 3 refills | Status: DC

## 2017-08-30 NOTE — Telephone Encounter (Signed)
Thank you :)

## 2017-11-20 ENCOUNTER — Other Ambulatory Visit: Payer: Self-pay | Admitting: Family Medicine

## 2017-11-20 DIAGNOSIS — Z1231 Encounter for screening mammogram for malignant neoplasm of breast: Secondary | ICD-10-CM

## 2017-11-21 ENCOUNTER — Encounter: Payer: Self-pay | Admitting: Family Medicine

## 2017-11-21 DIAGNOSIS — S025XXA Fracture of tooth (traumatic), initial encounter for closed fracture: Secondary | ICD-10-CM

## 2017-11-21 MED ORDER — PENICILLIN V POTASSIUM 500 MG PO TABS
500.0000 mg | ORAL_TABLET | Freq: Three times a day (TID) | ORAL | 0 refills | Status: DC
Start: 2017-11-21 — End: 2017-12-08

## 2017-11-26 ENCOUNTER — Ambulatory Visit (HOSPITAL_BASED_OUTPATIENT_CLINIC_OR_DEPARTMENT_OTHER): Payer: Medicare Other

## 2017-11-27 ENCOUNTER — Ambulatory Visit (HOSPITAL_BASED_OUTPATIENT_CLINIC_OR_DEPARTMENT_OTHER)
Admission: RE | Admit: 2017-11-27 | Discharge: 2017-11-27 | Disposition: A | Discharge status: Home / Self Care | Payer: Medicare Other | Source: Ambulatory Visit | Attending: Family Medicine | Admitting: Family Medicine

## 2017-11-27 DIAGNOSIS — Z1231 Encounter for screening mammogram for malignant neoplasm of breast: Secondary | ICD-10-CM | POA: Diagnosis not present

## 2017-12-04 ENCOUNTER — Other Ambulatory Visit: Payer: Self-pay | Admitting: Physician Assistant

## 2017-12-08 ENCOUNTER — Encounter: Payer: Self-pay | Admitting: Family Medicine

## 2017-12-08 ENCOUNTER — Ambulatory Visit: Payer: Self-pay | Admitting: *Deleted

## 2017-12-08 ENCOUNTER — Ambulatory Visit (INDEPENDENT_AMBULATORY_CARE_PROVIDER_SITE_OTHER): Payer: Medicare Other | Admitting: Family Medicine

## 2017-12-08 VITALS — BP 132/84 | HR 91 | Temp 97.9°F | Ht 65.5 in | Wt 189.5 lb

## 2017-12-08 DIAGNOSIS — R300 Dysuria: Secondary | ICD-10-CM | POA: Diagnosis not present

## 2017-12-08 DIAGNOSIS — N3001 Acute cystitis with hematuria: Secondary | ICD-10-CM

## 2017-12-08 LAB — POC URINALSYSI DIPSTICK (AUTOMATED)
Bilirubin, UA: NEGATIVE
Glucose, UA: NEGATIVE
Ketones, UA: NEGATIVE
Nitrite, UA: POSITIVE
PH UA: 5 (ref 5.0–8.0)
PROTEIN UA: NEGATIVE
Urobilinogen, UA: 1 E.U./dL

## 2017-12-08 MED ORDER — CEPHALEXIN 500 MG PO CAPS: 500.0000 mg | ORAL_CAPSULE | Freq: Two times a day (BID) | ORAL | 0 refills | Status: DC

## 2017-12-08 NOTE — Progress Notes (Signed)
Chief Complaint  Patient presents with  . Urinary Tract Infection  . Dysuria  . Nausea    Debra Burgess is a 72 y.o. female here for possible UTI.  Duration: 1 day. Symptoms: dysuria, back pain/malaise (typical of her UTI) Denies: urinary frequency, hematuria, urinary hesitancy and vomiting, vaginal discharge  ROS:  Constitutional: denies fever GU: As noted in HPI  Past Medical History:  Diagnosis Date  . Arthritis   . Cancer ([place])   . Diverticulosis   . Fibrocystic breast   . GERD (gastroesophageal reflux disease)   . Hiatal hernia   . History of chicken pox   . Hyperlipidemia   . Hypertension   . Mononucleosis    2/15  . MVP (mitral valve prolapse)   . Osteopenia 04/30/2015    BP 132/84 (BP Location: Left Arm, Patient Position: Sitting, Cuff Size: Normal)   Pulse 91   Temp 97.9 F (36.6 C) (Oral)   Ht 5' 5.5" (1.664 m)   Wt 189 lb 8 oz (86 kg)   LMP 10/15/1997   SpO2 97%   BMI 31.05 kg/m  General: Awake, alert, appears stated age Heart: RRR Lungs: normal respiratory effort, no accessory muscle usage Abd: BS+, soft, NT, ND, no masses or organomegaly MSK: No CVA tenderness, neg Lloyd's sign Psych: Age appropriate judgment and insight  Acute cystitis with hematuria - Plan: POCT Urinalysis Dipstick (Automated), Urine Culture  Dysuria - Plan: cephALEXin (KEFLEX) 500 MG capsule, POCT Urinalysis Dipstick (Automated), Urine Culture  Orders as above. UA strongly suggestive of infection. Stay hydrated. Seek immediate care if pt starts to develop fevers, new/worsening symptoms, uncontrollable N/V. F/u prn. The patient voiced understanding and agreement to the plan.  [place], DO 12/08/17 4:07 PM

## 2017-12-08 NOTE — Patient Instructions (Addendum)
Stay well hydrated.    We will be in touch regarding your culture results.  Let us know if you need anything.

## 2017-12-08 NOTE — Progress Notes (Signed)
Pre visit review using our clinic review tool, if applicable. No additional management support is needed unless otherwise documented below in the visit note. 

## 2017-12-08 NOTE — Telephone Encounter (Signed)
Call disconnected prior to taking with the pt regarding symptoms. Left message for pt to return call to the office to discuss symptoms. Pt did voice that she was having palpitations before call was transferred to triage nurse.

## 2017-12-10 LAB — URINE CULTURE
MICRO NUMBER: 90751585
SPECIMEN QUALITY: ADEQUATE

## 2017-12-11 ENCOUNTER — Other Ambulatory Visit: Payer: Self-pay | Admitting: Family Medicine

## 2017-12-11 ENCOUNTER — Encounter: Payer: Self-pay | Admitting: Family Medicine

## 2017-12-11 MED ORDER — NITROFURANTOIN MONOHYD MACRO 100 MG PO CAPS: 100.0000 mg | ORAL_CAPSULE | Freq: Two times a day (BID) | ORAL | 0 refills | Status: DC

## 2018-01-20 NOTE — Progress Notes (Addendum)
[place] at [facility] [place], [place], [place] [address] [phone]  Date:  12/22/2421   Name:  Debra Burgess   DOB:  01/17/1946   MRN:  [id number]  PCP:  Darreld Mclean, MD    Chief Complaint: Urinary Tract Infection (flank pain, dysuria, frequency, improving, just had uti last month)   History of Present Illness:  Debra Burgess is a 72 y.o. very pleasant female patient who presents with the following:  Here today with concern of UTI- she was also in for UTI in June of this year- culture at that time grew citrobacter History of hyperlipidemia, HTN I last saw her in the office in February for abd pain  Pt notes that she got back to normal after being treated for her last UTI. She used macrobid for 5 days.   She then was travelling for 3 weeks and got over- tired. She noted return of UTI sx- urgency, burning, back ache about 5 days ago  She has tried cranberry juice, water, getting more sleep She is feeling better today and hopes that she is getting over it on her own but we will certianlly check her urine today She had a subjective mild fever but this seems to have resolved She did have nausea but no vomiting  She is using her low dose vaginal estrogen once a week- would like to go to twice a week as her sx are better but not totally resolved. This is ok with me  She is on 10 mg of estradiol 10 mcg  Patient Active Problem List   Diagnosis Date Noted  . EKG, abnormal 09/02/2016  . Chest pain 09/02/2016  . Nonspecific abnormal finding in stool contents 11/28/2015  . Change in bowel habits 11/28/2015  . Gas 11/28/2015  . Bloating 11/28/2015  . Osteopenia 04/30/2015  . Palpitations 04/10/2015  . Hyperlipidemia 03/16/2015  . GERD (gastroesophageal reflux disease) 03/16/2015  . Mitral valve regurgitation 03/16/2015  . Essential hypertension 03/16/2015    Past Medical History:  Diagnosis Date  . Arthritis   .  Cancer ([place])   . Diverticulosis   . Fibrocystic breast   . GERD (gastroesophageal reflux disease)   . Hiatal hernia   . History of chicken pox   . Hyperlipidemia   . Hypertension   . Mononucleosis    2/15  . MVP (mitral valve prolapse)   . Osteopenia 04/30/2015    Past Surgical History:  Procedure Laterality Date  . BREAST SURGERY Bilateral 2010   breast reduction  . CORONARY ANGIOGRAPHY  09/02/2016  . CYST EXCISION  06/2016   hyoid bone area per patient  . HIATAL HERNIA REPAIR  2015   "severe hiatal hernia repair", robotic repair with pig skin sheath  . LEFT HEART CATH AND CORONARY ANGIOGRAPHY N/A 09/02/2016   Procedure: Left Heart Cath and Coronary Angiography;  Surgeon: Leonie Man, MD;  Location: [facility];  Service: Cardiovascular;  Laterality: N/A;  . REDUCTION MAMMAPLASTY Bilateral    2011  . TONSILLECTOMY AND ADENOIDECTOMY  1952    Social History   Tobacco Use  . Smoking status: Never Smoker  . Smokeless tobacco: Never Used  Substance Use Topics  . Alcohol use: No    Alcohol/week: 0.0 standard drinks    Comment: Rarely  . Drug use: No    Family History  Problem Relation Age of Onset  . Hypertension Mother   . Arthritis Mother   .  Colon polyps Mother        had 2 surgeries  . Prostate cancer Father   . Arthritis Maternal Grandmother   . Diabetes Cousin        maternal  . Arthritis Sister   . Hypertension Sister   . Ovarian cancer Sister        ovarian and breast  . Hypertension Sister   . Breast cancer Sister     Allergies  Allergen Reactions  . Mango Flavor Anaphylaxis    Had reaction to the fruit.  . Lipitor [Atorvastatin] Other (See Comments)    Caused neuropathy to one half of the patient's face    Medication list has been reviewed and updated.  Current Outpatient Medications on File Prior to Visit  Medication Sig Dispense Refill  . acetaminophen (TYLENOL) 325 MG tablet Take 325 mg by mouth every 6 (six) hours as needed  (for headaches or body pain).    Marland Kitchen aspirin 81 MG tablet Chew 81 mg by mouth daily.     Marland Kitchen atorvastatin (LIPITOR) 20 MG tablet Take 20 mg by mouth daily at 6 PM.    . Coenzyme Q10 (CO Q-10) 100 MG CAPS Take 100 mg by mouth 2 (two) times daily. GUMMIES    . docusate sodium (COLACE) 100 MG capsule Take 1 capsule (100 mg total) by mouth every 12 (twelve) hours. 60 capsule 0  . Estradiol 10 MCG TABS vaginal tablet Place 1 tablet vaginally 1-2x a week as needed to maintain vaginal comfort 24 tablet 3  . lisinopril (PRINIVIL,ZESTRIL) 10 MG tablet TAKE ONE TABLET BY MOUTH DAILY 90 tablet 2  . metoprolol tartrate (LOPRESSOR) 50 MG tablet Take 75 mg by mouth 2 (two) times daily.    . nitroGLYCERIN (NITROSTAT) 0.4 MG SL tablet Place 1 tablet (0.4 mg total) under the tongue every 5 (five) minutes x 3 doses as needed for chest pain. 25 tablet 3  . Omega-3 Fatty Acids (FISH OIL) 1000 MG CAPS Take 1 capsule (1,000 mg total) by mouth 2 (two) times daily. (Patient taking differently: Take 1 capsule by mouth 2 (two) times daily. BURP-FREE GUMMIES) 180 capsule 3   No current facility-administered medications on file prior to visit.     Review of Systems:  As per HPI- otherwise negative. No CP or SOB Nausea but no vomiting   Physical Examination: Vitals:   01/22/18 1002  BP: 130/80  Pulse: 61  Resp: 16  Temp: 97.9 F (36.6 C)  SpO2: 95%   Vitals:   01/22/18 1002  Weight: 188 lb 12.8 oz (85.6 kg)  Height: 5' 5.5" (1.664 m)   Body mass index is 30.94 kg/m. Ideal Body Weight: Weight in (lb) to have BMI = 25: 152.2  GEN: WDWN, NAD, Non-toxic, A & O x 3, looks well, overweight  HEENT: Atraumatic, Normocephalic. Neck supple. No masses, No LAD. Ears and Nose: No external deformity. CV: RRR, No M/G/R. No JVD. No thrill. No extra heart sounds. PULM: CTA B, no wheezes, crackles, rhonchi. No retractions. No resp. distress. No accessory muscle use. ABD: S, NT, ND, +BS. No rebound. No HSM.  Benign belly,  no CVA tenderness  EXTR: No c/c/e NEURO Normal gait.  PSYCH: Normally interactive. Conversant. Not depressed or anxious appearing.  Calm demeanor.   Creat clearance 77- ok to use nitrofurantoin  Results for orders placed or performed in visit on 01/22/18  POCT urinalysis dipstick  Result Value Ref Range   Color, UA yellow yellow   Clarity,  UA clear clear   Glucose, UA negative negative mg/dL   Bilirubin, UA negative negative   Ketones, POC UA negative negative mg/dL   Spec Grav, UA 1.020 1.010 - 1.025   Blood, UA negative negative   pH, UA 6.0 5.0 - 8.0   Protein Ur, POC negative negative mg/dL   Urobilinogen, UA 0.2 0.2 or 1.0 E.U./dL   Nitrite, UA Negative Negative   Leukocytes, UA Negative Negative     Assessment and Plan: Dysuria - Plan: Urine Culture, POCT urinalysis dipstick, nitrofurantoin, macrocrystal-monohydrate, (MACROBID) 100 MG capsule  Vaginal discomfort  Possible recurrent UTI, but UA is benign today She can start on abx now while we await her culture If any worsening she will alert me Ok to increase her low dose vaginal estrogen to twice a week Will plan further follow- up pending labs.  Signed Lamar Blinks, MD  Received her urine culture 8/9  Results for orders placed or performed in visit on 01/22/18  Urine Culture  Result Value Ref Range   MICRO NUMBER: [id number]    SPECIMEN QUALITY: ADEQUATE    Sample Source NOT GIVEN    STATUS: FINAL    Result:      Multiple organisms present, each less than 10,000 CFU/mL. These organisms, commonly found on external and internal genitalia, are considered to be colonizers. No further testing performed.  POCT urinalysis dipstick  Result Value Ref Range   Color, UA yellow yellow   Clarity, UA clear clear   Glucose, UA negative negative mg/dL   Bilirubin, UA negative negative   Ketones, POC UA negative negative mg/dL   Spec Grav, UA 1.020 1.010 - 1.025   Blood, UA negative negative   pH, UA 6.0 5.0 - 8.0    Protein Ur, POC negative negative mg/dL   Urobilinogen, UA 0.2 0.2 or 1.0 E.U./dL   Nitrite, UA Negative Negative   Leukocytes, UA Negative Negative   Message to pt

## 2018-01-22 ENCOUNTER — Encounter: Payer: Self-pay | Admitting: Family Medicine

## 2018-01-22 ENCOUNTER — Ambulatory Visit (INDEPENDENT_AMBULATORY_CARE_PROVIDER_SITE_OTHER): Payer: Medicare Other | Admitting: Family Medicine

## 2018-01-22 VITALS — BP 130/80 | HR 61 | Temp 97.9°F | Resp 16 | Ht 65.5 in | Wt 188.8 lb

## 2018-01-22 DIAGNOSIS — I2510 Atherosclerotic heart disease of native coronary artery without angina pectoris: Secondary | ICD-10-CM

## 2018-01-22 DIAGNOSIS — R300 Dysuria: Secondary | ICD-10-CM | POA: Diagnosis not present

## 2018-01-22 DIAGNOSIS — N949 Unspecified condition associated with female genital organs and menstrual cycle: Secondary | ICD-10-CM

## 2018-01-22 LAB — POCT URINALYSIS DIP (MANUAL ENTRY)
BILIRUBIN UA: NEGATIVE
BILIRUBIN UA: NEGATIVE mg/dL
GLUCOSE UA: NEGATIVE mg/dL
Leukocytes, UA: NEGATIVE
Nitrite, UA: NEGATIVE
PH UA: 6 (ref 5.0–8.0)
Protein Ur, POC: NEGATIVE mg/dL
RBC UA: NEGATIVE
SPEC GRAV UA: 1.02 (ref 1.010–1.025)
Urobilinogen, UA: 0.2 E.U./dL

## 2018-01-22 MED ORDER — ESTRADIOL 10 MCG VA TABS: ORAL_TABLET | VAGINAL | 3 refills | Status: AC

## 2018-01-22 MED ORDER — NITROFURANTOIN MONOHYD MACRO 100 MG PO CAPS: 100.0000 mg | ORAL_CAPSULE | Freq: Two times a day (BID) | ORAL | 0 refills | Status: DC

## 2018-01-22 MED ORDER — ESTRADIOL 10 MCG VA TABS: ORAL_TABLET | VAGINAL | 3 refills | Status: DC

## 2018-01-22 NOTE — Patient Instructions (Addendum)
I will be in touch with your urine culture  Your urine dip looks ok - we will go ahead and start you on macrobid twice a day for the time being  Certainly ok to increase your vaginal estrogen to twice a week

## 2018-01-22 NOTE — Addendum Note (Signed)
Addended by: Wynonia Musty A on: 01/22/2018 02:27 PM   Modules accepted: Orders

## 2018-01-23 ENCOUNTER — Encounter: Payer: Self-pay | Admitting: Family Medicine

## 2018-01-23 LAB — URINE CULTURE
MICRO NUMBER:: 90940035
SPECIMEN QUALITY:: ADEQUATE

## 2018-03-02 ENCOUNTER — Telehealth: Payer: Self-pay | Admitting: Physician Assistant

## 2018-03-02 MED ORDER — LOSARTAN POTASSIUM 50 MG PO TABS: 75.0000 mg | ORAL_TABLET | Freq: Every day | ORAL | 0 refills | Status: DC

## 2018-03-02 NOTE — Telephone Encounter (Signed)
Losartan 75mg  change to lisinopril due to recall.  Losartan should be available now. Please stop taking lisinopril 10mg  , then start taking losartan 75mg  daily (2 days after stopping lisinopril).

## 2018-03-02 NOTE — Telephone Encounter (Signed)
Pt notified refill sent

## 2018-03-02 NOTE — Telephone Encounter (Signed)
New message   Pt c/o medication issue:  1. Name of Medication: lisinopril (PRINIVIL,ZESTRIL) 10 MG tablet  2. How are you currently taking this medication (dosage and times per day)?1 time daily  3. Are you having a reaction (difficulty breathing--STAT)? No   4. What is your medication issue? Patient has a dry cough

## 2018-03-12 NOTE — Progress Notes (Addendum)
Subjective:   Debra Burgess is a 72 y.o. female who presents for Medicare Annual (Subsequent) preventive examination.  Review of Systems: No ROS.  Medicare Wellness Visit. Additional risk factors are reflected in the social history. Cardiac Risk Factors include: advanced age (>40men, >75 women);dyslipidemia;hypertension;obesity (BMI >30kg/m2) Sleep patterns: no issues   Home Safety/Smoke Alarms: Feels safe in home. Smoke alarms in place. Lives alone in 2nd floor condo. Has elevator.  Female:         Mammo-utd       Dexa scan-  declines      CCS- next due 11/2025     Objective:     Vitals: BP (!) 142/80 (BP Location: Left Arm, Patient Position: Sitting, Cuff Size: Normal)   Pulse 60   Ht 5\' 6"  (1.676 m)   Wt 188 lb 3.2 oz (85.4 kg)   LMP 10/15/1997   SpO2 99%   BMI 30.38 kg/m   Body mass index is 30.38 kg/m.  Advanced Directives 03/13/2018 05/13/2017 03/12/2017 09/02/2016 12/04/2015 11/15/2015  Does Patient Have a Medical Advance Directive? No No No No Yes No  Does patient want to make changes to medical advance directive? Yes (MAU/Ambulatory/Procedural Areas - Information given) - Yes (MAU/Ambulatory/Procedural Areas - Information given) - - -  Copy of Healthcare Power of Attorney in Chart? - - - - No - copy requested -  Would patient like information on creating a medical advance directive? - No - Patient declined - No - Patient declined - No - patient declined information    Tobacco Social History   Tobacco Use  Smoking Status Never Smoker  Smokeless Tobacco Never Used     Counseling given: Not Answered   Clinical Intake: Pain : No/denies pain   Past Medical History:  Diagnosis Date  . Arthritis   . Cancer ([place])   . Diverticulosis   . Fibrocystic breast   . GERD (gastroesophageal reflux disease)   . Hiatal hernia   . History of chicken pox   . Hyperlipidemia   . Hypertension   . Mononucleosis    2/15  . MVP (mitral valve prolapse)   . Osteopenia  04/30/2015   Past Surgical History:  Procedure Laterality Date  . BREAST SURGERY Bilateral 2010   breast reduction  . CORONARY ANGIOGRAPHY  09/02/2016  . CYST EXCISION  06/2016   hyoid bone area per patient  . HIATAL HERNIA REPAIR  2015   "severe hiatal hernia repair", robotic repair with pig skin sheath  . LEFT HEART CATH AND CORONARY ANGIOGRAPHY N/A 09/02/2016   Procedure: Left Heart Cath and Coronary Angiography;  Surgeon: Leonie Man, MD;  Location: [facility];  Service: Cardiovascular;  Laterality: N/A;  . REDUCTION MAMMAPLASTY Bilateral    2011  . TONSILLECTOMY AND ADENOIDECTOMY  1952   Family History  Problem Relation Age of Onset  . Hypertension Mother   . Arthritis Mother   . Colon polyps Mother        had 2 surgeries  . Prostate cancer Father   . Arthritis Maternal Grandmother   . Diabetes Cousin        maternal  . Arthritis Sister   . Hypertension Sister   . Ovarian cancer Sister        ovarian and breast  . Hypertension Sister   . Breast cancer Sister    Social History   Socioeconomic History  . Marital status: Single    Spouse name: Not on file  . Number  of children: 4  . Years of education: Not on file  . Highest education level: Not on file  Occupational History  . Occupation: Pharmacist, hospital  Social Needs  . Financial resource strain: Not on file  . Food insecurity:    Worry: Not on file    Inability: Not on file  . Transportation needs:    Medical: Not on file    Non-medical: Not on file  Tobacco Use  . Smoking status: Never Smoker  . Smokeless tobacco: Never Used  Substance and Sexual Activity  . Alcohol use: No    Alcohol/week: 0.0 standard drinks    Comment: Rarely  . Drug use: No  . Sexual activity: Never  Lifestyle  . Physical activity:    Days per week: Not on file    Minutes per session: Not on file  . Stress: Not on file  Relationships  . Social connections:    Talks on phone: Not on file    Gets together: Not on file     Attends religious service: Not on file    Active member of club or organization: Not on file    Attends meetings of clubs or organizations: Not on file    Relationship status: Not on file  Other Topics Concern  . Not on file  Social History Narrative   Recently moved to be near her daughter Mosetta Putt.     Has 4 children   Teaches latin Youth worker) Technology, STEM (middle school) Writer at [employer]   Completed masters degree   Divorced     Outpatient Encounter Medications as of 03/13/2018  Medication Sig  . acetaminophen (TYLENOL) 325 MG tablet Take 325 mg by mouth every 6 (six) hours as needed (for headaches or body pain).  Marland Kitchen aspirin 81 MG tablet Chew 81 mg by mouth daily.   Marland Kitchen atorvastatin (LIPITOR) 20 MG tablet Take 20 mg by mouth daily at 6 PM.  . Coenzyme Q10 (CO Q-10) 100 MG CAPS Take 100 mg by mouth 2 (two) times daily. GUMMIES  . Estradiol 10 MCG TABS vaginal tablet Place 1 tablet vaginally 2x a week as needed to maintain vaginal comfort  . losartan (COZAAR) 50 MG tablet Take 1.5 tablets (75 mg total) by mouth daily.  . metoprolol tartrate (LOPRESSOR) 50 MG tablet Take 75 mg by mouth 2 (two) times daily.  . nitroGLYCERIN (NITROSTAT) 0.4 MG SL tablet Place 1 tablet (0.4 mg total) under the tongue every 5 (five) minutes x 3 doses as needed for chest pain.  . Omega-3 Fatty Acids (FISH OIL) 1000 MG CAPS Take 1 capsule (1,000 mg total) by mouth 2 (two) times daily. (Patient taking differently: Take 1 capsule by mouth 2 (two) times daily. BURP-FREE GUMMIES)  . docusate sodium (COLACE) 100 MG capsule Take 1 capsule (100 mg total) by mouth every 12 (twelve) hours. (Patient not taking: Reported on 03/13/2018)  . [DISCONTINUED] nitrofurantoin, macrocrystal-monohydrate, (MACROBID) 100 MG capsule Take 1 capsule (100 mg total) by mouth 2 (two) times daily.   No facility-administered encounter medications on file as of 03/13/2018.     Activities of Daily  Living In your present state of health, do you have any difficulty performing the following activities: 03/13/2018  Hearing? N  Vision? N  Difficulty concentrating or making decisions? N  Walking or climbing stairs? N  Dressing or bathing? N  Doing errands, shopping? N  Preparing Food and eating ? N  Using the Toilet? N  In the past  six months, have you accidently leaked urine? N  Do you have problems with loss of bowel control? N  Managing your Medications? N  Managing your Finances? N  Housekeeping or managing your Housekeeping? N  Some recent data might be hidden    Patient Care Team: Copland, Gay Filler, MD as PCP - General (Family Medicine) Stanford Breed Denice Bors, MD as PCP - Cardiology (Cardiology)    Assessment:   This is a routine wellness examination for Debra Burgess. Physical assessment deferred to PCP.  Exercise Activities and Dietary recommendations Current Exercise Habits: Home exercise routine, Type of exercise: walking, Time (Minutes): 30, Frequency (Times/Week): 7, Weekly Exercise (Minutes/Week): 210, Intensity: Mild, Exercise limited by: None identified   Diet (meal preparation, eat out, water intake, caffeinated beverages, dairy products, fruits and vegetables): in general, a "healthy" diet  , well balanced     Goals    . Finish second novel    . 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 (pt-stated)       Fall Risk Fall Risk  03/13/2018 03/12/2017  Falls in the past year? Yes No  Number falls in past yr: 1 -  Injury with Fall? Yes -  Follow up Education provided;Falls prevention discussed -    Depression Screen PHQ 2/9 Scores 03/13/2018 03/12/2017  PHQ - 2 Score 0 0     Cognitive Function Ad8 score reviewed for issues:  Issues making decisions:no  Less interest in hobbies / activities:no  Repeats questions, stories (family complaining):no  Trouble using ordinary gadgets (microwave, computer, phone):no  Forgets the month or year: no  Mismanaging finances:  no  Remembering appts:no  Daily problems with thinking and/or memory:no Ad8 score is=        Immunization History  Administered Date(s) Administered  . Pneumococcal Conjugate-13 03/15/2015  . Pneumococcal Polysaccharide-23 04/19/2016  . Tdap 09/16/2014, 08/11/2017  . Zoster 06/17/2009   Screening Tests Health Maintenance  Topic Date Due  . INFLUENZA VACCINE  01/15/2018  . MAMMOGRAM  11/28/2019  . COLONOSCOPY  12/06/2025  . TETANUS/TDAP  08/12/2027  . DEXA SCAN  Completed  . Hepatitis C Screening  Completed  . PNA vac Low Risk Adult  Completed      Plan:    Please schedule your next medicare wellness visit with me in 1 yr.  Continue to eat heart healthy diet (full of fruits, vegetables, whole grains, lean protein, water--limit salt, fat, and sugar intake) and  physical activity as tolerated.   I have personally reviewed and noted the following in the patient's chart:   . Medical and social history . Use of alcohol, tobacco or illicit drugs  . Current medications and supplements . Functional ability and status . Nutritional status . Physical activity . Advanced directives . List of other physicians . Hospitalizations, surgeries, and ER visits in previous 12 months . Vitals . Screenings to include cognitive, depression, and falls . Referrals and appointments  In addition, I have reviewed and discussed with patient certain preventive protocols, quality metrics, and best practice recommendations. A written personalized care plan for preventive services as well as general preventive health recommendations were provided to patient.     Shela Nevin, [place]  03/13/2018  Medical screening examination/treatment was performed by qualified clinical staff member and as supervising physician I was immediately available for consultation/collaboration. I have reviewed documentation and agree with assessment and plan.  Penni Homans, MD

## 2018-03-13 ENCOUNTER — Encounter: Payer: Self-pay | Admitting: *Deleted

## 2018-03-13 ENCOUNTER — Ambulatory Visit (INDEPENDENT_AMBULATORY_CARE_PROVIDER_SITE_OTHER): Payer: Medicare Other | Admitting: *Deleted

## 2018-03-13 VITALS — BP 142/80 | HR 60 | Ht 66.0 in | Wt 188.2 lb

## 2018-03-13 DIAGNOSIS — Z0000 Encounter for general adult medical examination without abnormal findings: Secondary | ICD-10-CM | POA: Diagnosis not present

## 2018-03-13 NOTE — Patient Instructions (Signed)
Please schedule your next medicare wellness visit with me in 1 yr.  Continue to eat heart healthy diet (full of fruits, vegetables, whole grains, lean protein, water--limit salt, fat, and sugar intake) and  physical activity as tolerated.   Debra Burgess , Thank you for taking time to come for your Medicare Wellness Visit. I appreciate your ongoing commitment to your health goals. Please review the following plan we discussed and let me know if I can assist you in the future.   These are the goals we discussed: Goals    . Finish second novel    . 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 (pt-stated)       This is a list of the screening recommended for you and due dates:  Health Maintenance  Topic Date Due  . Flu Shot  01/15/2018  . Mammogram  11/28/2019  . Colon Cancer Screening  12/06/2025  . Tetanus Vaccine  08/12/2027  . DEXA scan (bone density measurement)  Completed  .  Hepatitis C: One time screening is recommended by Center for Disease Control  (CDC) for  adults born from [birth year] through 1963.   Completed  . Pneumonia vaccines  Completed    Health Maintenance for Postmenopausal Women Menopause is a normal process in which your reproductive ability comes to an end. This process happens gradually over a span of months to years, usually between the ages of 79 and 45. Menopause is complete when you have missed 12 consecutive menstrual periods. It is important to talk with your health care provider about some of the most common conditions that affect postmenopausal women, such as heart disease, cancer, and bone loss (osteoporosis). Adopting a healthy lifestyle and getting preventive care can help to promote your health and wellness. Those actions can also lower your chances of developing some of these common conditions. What should I know about menopause? During menopause, you may experience a number of symptoms, such as:  Moderate-to-severe hot flashes.  Night sweats.  Decrease in sex  drive.  Mood swings.  Headaches.  Tiredness.  Irritability.  Memory problems.  Insomnia.  Choosing to treat or not to treat menopausal changes is an individual decision that you make with your health care provider. What should I know about hormone replacement therapy and supplements? Hormone therapy products are effective for treating symptoms that are associated with menopause, such as hot flashes and night sweats. Hormone replacement carries certain risks, especially as you become older. If you are thinking about using estrogen or estrogen with progestin treatments, discuss the benefits and risks with your health care provider. What should I know about heart disease and stroke? Heart disease, heart attack, and stroke become more likely as you age. This may be due, in part, to the hormonal changes that your body experiences during menopause. These can affect how your body processes dietary fats, triglycerides, and cholesterol. Heart attack and stroke are both medical emergencies. There are many things that you can do to help prevent heart disease and stroke:  Have your blood pressure checked at least every 1-2 years. High blood pressure causes heart disease and increases the risk of stroke.  If you are 73-26 years old, ask your health care provider if you should take aspirin to prevent a heart attack or a stroke.  Do not use any tobacco products, including cigarettes, chewing tobacco, or electronic cigarettes. If you need help quitting, ask your health care provider.  It is important to eat a healthy diet and maintain a healthy  weight. ? Be sure to include plenty of vegetables, fruits, low-fat dairy products, and lean protein. ? Avoid eating foods that are high in solid fats, added sugars, or salt (sodium).  Get regular exercise. This is one of the most important things that you can do for your health. ? Try to exercise for at least 150 minutes each week. The type of exercise that  you do should increase your heart rate and make you sweat. This is known as moderate-intensity exercise. ? Try to do strengthening exercises at least twice each week. Do these in addition to the moderate-intensity exercise.  Know your numbers.Ask your health care provider to check your cholesterol and your blood glucose. Continue to have your blood tested as directed by your health care provider.  What should I know about cancer screening? There are several types of cancer. Take the following steps to reduce your risk and to catch any cancer development as early as possible. Breast Cancer  Practice breast self-awareness. ? This means understanding how your breasts normally appear and feel. ? It also means doing regular breast self-exams. Let your health care provider know about any changes, no matter how small.  If you are 26 or older, have a clinician do a breast exam (clinical breast exam or CBE) every year. Depending on your age, family history, and medical history, it may be recommended that you also have a yearly breast X-ray (mammogram).  If you have a family history of breast cancer, talk with your health care provider about genetic screening.  If you are at high risk for breast cancer, talk with your health care provider about having an MRI and a mammogram every year.  Breast cancer (BRCA) gene test is recommended for women who have family members with BRCA-related cancers. Results of the assessment will determine the need for genetic counseling and BRCA1 and for BRCA2 testing. BRCA-related cancers include these types: ? Breast. This occurs in males or females. ? Ovarian. ? Tubal. This may also be called fallopian tube cancer. ? Cancer of the abdominal or pelvic lining (peritoneal cancer). ? Prostate. ? Pancreatic.  Cervical, Uterine, and Ovarian Cancer Your health care provider may recommend that you be screened regularly for cancer of the pelvic organs. These include your  ovaries, uterus, and vagina. This screening involves a pelvic exam, which includes checking for microscopic changes to the surface of your cervix (Pap test).  For women ages 21-65, health care providers may recommend a pelvic exam and a Pap test every three years. For women ages 85-65, they may recommend the Pap test and pelvic exam, combined with testing for human papilloma virus (HPV), every five years. Some types of HPV increase your risk of cervical cancer. Testing for HPV may also be done on women of any age who have unclear Pap test results.  Other health care providers may not recommend any screening for nonpregnant women who are considered low risk for pelvic cancer and have no symptoms. Ask your health care provider if a screening pelvic exam is right for you.  If you have had past treatment for cervical cancer or a condition that could lead to cancer, you need Pap tests and screening for cancer for at least 20 years after your treatment. If Pap tests have been discontinued for you, your risk factors (such as having a new sexual partner) need to be reassessed to determine if you should start having screenings again. Some women have medical problems that increase the chance of getting  cervical cancer. In these cases, your health care provider may recommend that you have screening and Pap tests more often.  If you have a family history of uterine cancer or ovarian cancer, talk with your health care provider about genetic screening.  If you have vaginal bleeding after reaching menopause, tell your health care provider.  There are currently no reliable tests available to screen for ovarian cancer.  Lung Cancer Lung cancer screening is recommended for adults 64-69 years old who are at high risk for lung cancer because of a history of smoking. A yearly low-dose CT scan of the lungs is recommended if you:  Currently smoke.  Have a history of at least 30 pack-years of smoking and you currently  smoke or have quit within the past 15 years. A pack-year is smoking an average of one pack of cigarettes per day for one year.  Yearly screening should:  Continue until it has been 15 years since you quit.  Stop if you develop a health problem that would prevent you from having lung cancer treatment.  Colorectal Cancer  This type of cancer can be detected and can often be prevented.  Routine colorectal cancer screening usually begins at age 36 and continues through age 6.  If you have risk factors for colon cancer, your health care provider may recommend that you be screened at an earlier age.  If you have a family history of colorectal cancer, talk with your health care provider about genetic screening.  Your health care provider may also recommend using home test kits to check for hidden blood in your stool.  A small camera at the end of a tube can be used to examine your colon directly (sigmoidoscopy or colonoscopy). This is done to check for the earliest forms of colorectal cancer.  Direct examination of the colon should be repeated every 5-10 years until age 1. However, if early forms of precancerous polyps or small growths are found or if you have a family history or genetic risk for colorectal cancer, you may need to be screened more often.  Skin Cancer  Check your skin from head to toe regularly.  Monitor any moles. Be sure to tell your health care provider: ? About any new moles or changes in moles, especially if there is a change in a mole's shape or color. ? If you have a mole that is larger than the size of a pencil eraser.  If any of your family members has a history of skin cancer, especially at a young age, talk with your health care provider about genetic screening.  Always use sunscreen. Apply sunscreen liberally and repeatedly throughout the day.  Whenever you are outside, protect yourself by wearing long sleeves, pants, a wide-brimmed hat, and  sunglasses.  What should I know about osteoporosis? Osteoporosis is a condition in which bone destruction happens more quickly than new bone creation. After menopause, you may be at an increased risk for osteoporosis. To help prevent osteoporosis or the bone fractures that can happen because of osteoporosis, the following is recommended:  If you are 44-33 years old, get at least 1,000 mg of calcium and at least 600 mg of vitamin D per day.  If you are older than age 109 but younger than age 28, get at least 1,200 mg of calcium and at least 600 mg of vitamin D per day.  If you are older than age 44, get at least 1,200 mg of calcium and at least 800 mg  of vitamin D per day.  Smoking and excessive alcohol intake increase the risk of osteoporosis. Eat foods that are rich in calcium and vitamin D, and do weight-bearing exercises several times each week as directed by your health care provider. What should I know about how menopause affects my mental health? Depression may occur at any age, but it is more common as you become older. Common symptoms of depression include:  Low or sad mood.  Changes in sleep patterns.  Changes in appetite or eating patterns.  Feeling an overall lack of motivation or enjoyment of activities that you previously enjoyed.  Frequent crying spells.  Talk with your health care provider if you think that you are experiencing depression. What should I know about immunizations? It is important that you get and maintain your immunizations. These include:  Tetanus, diphtheria, and pertussis (Tdap) booster vaccine.  Influenza every year before the flu season begins.  Pneumonia vaccine.  Shingles vaccine.  Your health care provider may also recommend other immunizations. This information is not intended to replace advice given to you by your health care provider. Make sure you discuss any questions you have with your health care provider. Document Released:  07/26/2005 Document Revised: 12/22/2015 Document Reviewed: 03/07/2015 Elsevier Interactive Patient Education  2018 [employer].

## 2018-04-07 NOTE — Progress Notes (Addendum)
[place] at [facility] [place], [place], [place] [address] [phone]  Date:  26/83/4196   Name:  Debra Burgess   DOB:  1945/10/12   MRN:  [id number]  PCP:  Darreld Mclean, MD    Chief Complaint: Cough (fever off and on, one week, hoarse voice, sinus infection, congestion)   History of Present Illness:  Debra Burgess is a 72 y.o. very pleasant female patient who presents with the following:  Here today with concern of cough  She has felt sick for about 10 days but she had family in town so she could not come in until now that they have gone home She noted swollen glands in her neck  She was tired and slept a lot. However she did not feel so bad as to think it was the flu.   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   History of hyperlipidemia, GERD, HTN  Flu:  Will have soon   Most recent labs in February of this year. Leukocytosis- check white count today   Dr Stanford Breed changed her from lisinopril to valsartan recently due to cough. Asa lipitor estradiol vag Losartan Lopressor   Her frequent UTI and vaginal soreness is much better now that she is using estrogen vaginally 2x/ week   Patient Active Problem List   Diagnosis Date Noted  . EKG, abnormal 09/02/2016  . Chest pain 09/02/2016  . Nonspecific abnormal finding in stool contents 11/28/2015  . Change in bowel habits 11/28/2015  . Gas 11/28/2015   . Bloating 11/28/2015  . Osteopenia 04/30/2015  . Palpitations 04/10/2015  . Hyperlipidemia 03/16/2015  . GERD (gastroesophageal reflux disease) 03/16/2015  . Mitral valve regurgitation 03/16/2015  . Essential hypertension 03/16/2015    Past Medical History:  Diagnosis Date  . Arthritis   . Cancer ([place])   . Diverticulosis   . Fibrocystic breast   . GERD (gastroesophageal reflux disease)   . Hiatal hernia   . History of chicken pox   . Hyperlipidemia   . Hypertension   . Mononucleosis    2/15  . MVP (mitral valve prolapse)   . Osteopenia 04/30/2015    Past Surgical History:  Procedure Laterality Date  . BREAST SURGERY Bilateral 2010   breast reduction  . CORONARY ANGIOGRAPHY  09/02/2016  . CYST EXCISION  06/2016   hyoid bone area per patient  . HIATAL HERNIA REPAIR  2015   "severe hiatal hernia repair", robotic repair with pig skin sheath  . LEFT HEART CATH AND CORONARY ANGIOGRAPHY N/A 09/02/2016   Procedure: Left Heart Cath and Coronary Angiography;  Surgeon: Leonie Man, MD;  Location: [facility];  Service: Cardiovascular;  Laterality: N/A;  . REDUCTION MAMMAPLASTY Bilateral    2011  . [place]    Social History  Tobacco Use  . Smoking status: Never Smoker  . Smokeless tobacco: Never Used  Substance Use Topics  . Alcohol use: No    Alcohol/week: 0.0 standard drinks    Comment: Rarely  . Drug use: No    Family History  Problem Relation Age of Onset  . Hypertension Mother   . Arthritis Mother   . Colon polyps Mother        had 2 surgeries  . Prostate cancer Father   . Arthritis Maternal Grandmother   . Diabetes Cousin        maternal  . Arthritis Sister   . Hypertension Sister   . Ovarian cancer Sister        ovarian and breast  . Hypertension Sister   . Breast cancer Sister     Allergies  Allergen Reactions  . Mango Flavor Anaphylaxis    Had reaction to the fruit.  . Lipitor [Atorvastatin] Other  (See Comments)    Caused neuropathy to one half of the patient's face    Medication list has been reviewed and updated.  Current Outpatient Medications on File Prior to Visit  Medication Sig Dispense Refill  . acetaminophen (TYLENOL) 325 MG tablet Take 325 mg by mouth every 6 (six) hours as needed (for headaches or body pain).    Marland Kitchen aspirin 81 MG tablet Chew 81 mg by mouth daily.     Marland Kitchen atorvastatin (LIPITOR) 20 MG tablet Take 20 mg by mouth daily at 6 PM.    . Coenzyme Q10 (CO Q-10) 100 MG CAPS Take 100 mg by mouth 2 (two) times daily. GUMMIES    . docusate sodium (COLACE) 100 MG capsule Take 1 capsule (100 mg total) by mouth every 12 (twelve) hours. (Patient not taking: Reported on 03/13/2018) 60 capsule 0  . Estradiol 10 MCG TABS vaginal tablet Place 1 tablet vaginally 2x a week as needed to maintain vaginal comfort 24 tablet 3  . losartan (COZAAR) 50 MG tablet Take 1.5 tablets (75 mg total) by mouth daily. 180 tablet 0  . metoprolol tartrate (LOPRESSOR) 50 MG tablet Take 75 mg by mouth 2 (two) times daily.    . nitroGLYCERIN (NITROSTAT) 0.4 MG SL tablet Place 1 tablet (0.4 mg total) under the tongue every 5 (five) minutes x 3 doses as needed for chest pain. 25 tablet 3  . Omega-3 Fatty Acids (FISH OIL) 1000 MG CAPS Take 1 capsule (1,000 mg total) by mouth 2 (two) times daily. (Patient taking differently: Take 1 capsule by mouth 2 (two) times daily. BURP-FREE GUMMIES) 180 capsule 3   No current facility-administered medications on file prior to visit.     Review of Systems:  As per HPI- otherwise negative. No fever Overall she is feeling better except for nasal/ sinus sx    Physical Examination: Vitals:   04/08/18 1045  BP: 128/80  Pulse: 62  Resp: 16  Temp: 97.7 F (36.5 C)  SpO2: 96%   Vitals:   04/08/18 1045  Weight: 187 lb (84.8 kg)  Height: 5\' 6"  (1.676 m)   Body mass index is 30.18 kg/m. Ideal Body Weight: Weight in (lb) to have BMI = 25: 154.6  GEN: WDWN, NAD,  Non-toxic, A & O x 3, overweight, looks well  Mildly hoarse voice HEENT: Atraumatic, Normocephalic. Neck supple. No masses, No LAD.  Bilateral TM wnl, oropharynx normal.  PEERL,EOMI.   Frontal sinuses are tender to percussion Ears and Nose: No external deformity. CV: RRR, No M/G/R. No JVD. No  thrill. No extra heart sounds. PULM: CTA B, no wheezes, crackles, rhonchi. No retractions. No resp. distress. No accessory muscle use.Marland Kitchen EXTR: No c/c/e NEURO Normal gait.  PSYCH: Normally interactive. Conversant. Not depressed or anxious appearing.  Calm demeanor.    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

## 2018-04-08 ENCOUNTER — Encounter: Payer: Self-pay | Admitting: Family Medicine

## 2018-04-08 ENCOUNTER — Ambulatory Visit (INDEPENDENT_AMBULATORY_CARE_PROVIDER_SITE_OTHER): Payer: Medicare Other | Admitting: Family Medicine

## 2018-04-08 VITALS — BP 128/80 | HR 62 | Temp 97.7°F | Resp 16 | Ht 66.0 in | Wt 187.0 lb

## 2018-04-08 DIAGNOSIS — D72829 Elevated white blood cell count, unspecified: Secondary | ICD-10-CM | POA: Diagnosis not present

## 2018-04-08 DIAGNOSIS — I2510 Atherosclerotic heart disease of native coronary artery without angina pectoris: Secondary | ICD-10-CM | POA: Diagnosis not present

## 2018-04-08 DIAGNOSIS — J0110 Acute frontal sinusitis, unspecified: Secondary | ICD-10-CM | POA: Diagnosis not present

## 2018-04-08 LAB — CBC
HEMATOCRIT: 38.1 % (ref 36.0–46.0)
HEMOGLOBIN: 12.9 g/dL (ref 12.0–15.0)
MCHC: 33.9 g/dL (ref 30.0–36.0)
MCV: 87.2 fl (ref 78.0–100.0)
PLATELETS: 366 10*3/uL (ref 150.0–400.0)
RBC: 4.37 Mil/uL (ref 3.87–5.11)
RDW: 13.5 % (ref 11.5–15.5)
WBC: 9.1 10*3/uL (ref 4.0–10.5)

## 2018-04-08 MED ORDER — PREDNISONE 20 MG PO TABS: ORAL_TABLET | ORAL | 0 refills | Status: DC

## 2018-04-08 MED ORDER — AMOXICILLIN 500 MG PO CAPS: 1000.0000 mg | ORAL_CAPSULE | Freq: Two times a day (BID) | ORAL | 0 refills | Status: DC

## 2018-04-08 NOTE — Patient Instructions (Signed)
Good to see you again today!   Rest your body and your voice For sinus infection- amoxicillin twice daily for 10 days prednisone take 1 pill daily for 3-5 days  Let me know if not continuing to improve Get your flu shot once you return from your trip  Will repeat your blood counts for you today!

## 2018-04-23 NOTE — Progress Notes (Signed)
HPI: FU mitral valve prolapse. Echocardiogram in 2012 showed normal LV function with no significant valvular heart disease. Carotid Dopplers 2012 showed minimal internal carotid artery disease bilaterally. Cath 3/18 showed 35 LAD, 45 Lcx, 60 ramus and hyperdynamic LV function. Since last seen, the patient denies any dyspnea on exertion, orthopnea, PND, pedal edema, palpitations, syncope or chest pain.   Current Outpatient Medications  Medication Sig Dispense Refill  . acetaminophen (TYLENOL) 325 MG tablet Take 325 mg by mouth every 6 (six) hours as needed (for headaches or body pain).    Marland Kitchen amoxicillin (AMOXIL) 500 MG capsule Take 2 capsules (1,000 mg total) by mouth 2 (two) times daily. 40 capsule 0  . aspirin 81 MG tablet Chew 81 mg by mouth daily.     Marland Kitchen atorvastatin (LIPITOR) 20 MG tablet Take 20 mg by mouth daily at 6 PM.    . Coenzyme Q10 (CO Q-10) 100 MG CAPS Take 100 mg by mouth 2 (two) times daily. GUMMIES    . docusate sodium (COLACE) 100 MG capsule Take 1 capsule (100 mg total) by mouth every 12 (twelve) hours. 60 capsule 0  . Estradiol 10 MCG TABS vaginal tablet Place 1 tablet vaginally 2x a week as needed to maintain vaginal comfort 24 tablet 3  . losartan (COZAAR) 50 MG tablet Take 1.5 tablets (75 mg total) by mouth daily. 180 tablet 0  . metoprolol tartrate (LOPRESSOR) 50 MG tablet Take 75 mg by mouth 2 (two) times daily.    . nitroGLYCERIN (NITROSTAT) 0.4 MG SL tablet Place 1 tablet (0.4 mg total) under the tongue every 5 (five) minutes x 3 doses as needed for chest pain. 25 tablet 3  . Omega-3 Fatty Acids (FISH OIL) 1000 MG CAPS Take 1 capsule (1,000 mg total) by mouth 2 (two) times daily. (Patient taking differently: Take 1 capsule by mouth 2 (two) times daily. BURP-FREE GUMMIES) 180 capsule 3   No current facility-administered medications for this visit.      Past Medical History:  Diagnosis Date  . Arthritis   . Cancer ([place])   . Diverticulosis   . Fibrocystic  breast   . GERD (gastroesophageal reflux disease)   . Hiatal hernia   . History of chicken pox   . Hyperlipidemia   . Hypertension   . Mononucleosis    2/15  . MVP (mitral valve prolapse)   . Osteopenia 04/30/2015    Past Surgical History:  Procedure Laterality Date  . BREAST SURGERY Bilateral 2010   breast reduction  . CORONARY ANGIOGRAPHY  09/02/2016  . CYST EXCISION  06/2016   hyoid bone area per patient  . HIATAL HERNIA REPAIR  2015   "severe hiatal hernia repair", robotic repair with pig skin sheath  . LEFT HEART CATH AND CORONARY ANGIOGRAPHY N/A 09/02/2016   Procedure: Left Heart Cath and Coronary Angiography;  Surgeon: Leonie Man, MD;  Location: [facility];  Service: Cardiovascular;  Laterality: N/A;  . REDUCTION MAMMAPLASTY Bilateral    2011  . TONSILLECTOMY AND ADENOIDECTOMY  1952    Social History   Socioeconomic History  . Marital status: Single    Spouse name: Not on file  . Number of children: 4  . Years of education: Not on file  . Highest education level: Not on file  Occupational History  . Occupation: Pharmacist, hospital  Social Needs  . Financial resource strain: Not on file  . Food insecurity:    Worry: Not on file  Inability: Not on file  . Transportation needs:    Medical: Not on file    Non-medical: Not on file  Tobacco Use  . Smoking status: Never Smoker  . Smokeless tobacco: Never Used  Substance and Sexual Activity  . Alcohol use: No    Alcohol/week: 0.0 standard drinks    Comment: Rarely  . Drug use: No  . Sexual activity: Never  Lifestyle  . Physical activity:    Days per week: Not on file    Minutes per session: Not on file  . Stress: Not on file  Relationships  . Social connections:    Talks on phone: Not on file    Gets together: Not on file    Attends religious service: Not on file    Active member of club or organization: Not on file    Attends meetings of clubs or organizations: Not on file    Relationship status:  Not on file  . Intimate partner violence:    Fear of current or ex partner: Not on file    Emotionally abused: Not on file    Physically abused: Not on file    Forced sexual activity: Not on file  Other Topics Concern  . Not on file  Social History Narrative   Recently moved to be near her daughter Mosetta Putt.     Has 4 children   Teaches latin Youth worker) Technology, STEM (middle school) Writer at [employer]   Completed masters degree   Divorced     Family History  Problem Relation Age of Onset  . Hypertension Mother   . Arthritis Mother   . Colon polyps Mother        had 2 surgeries  . Prostate cancer Father   . Arthritis Maternal Grandmother   . Diabetes Cousin        maternal  . Arthritis Sister   . Hypertension Sister   . Ovarian cancer Sister        ovarian and breast  . Hypertension Sister   . Breast cancer Sister     ROS: no fevers or chills, productive cough, hemoptysis, dysphasia, odynophagia, melena, hematochezia, dysuria, hematuria, rash, seizure activity, orthopnea, PND, pedal edema, claudication. Remaining systems are negative.  Physical Exam: Well-developed well-nourished in no acute distress.  Skin is warm and dry.  HEENT is normal.  Neck is supple.  Chest is clear to auscultation with normal expansion.  Cardiovascular exam is regular rate and rhythm.  Abdominal exam nontender or distended. No masses palpated. Extremities show no edema. neuro grossly intact  ECG-sinus rhythm at a rate of 72.  No ST changes.  Personally reviewed  A/P  1 coronary artery disease-patient denies chest pain.  Continue aspirin and statin.  2 hypertension-blood pressure borderline.  However typically controlled.  Continue present medications and follow.  Check potassium and renal function.  3 hyperlipidemia-continue statin.  Check if it and liver.  4 palpitations-no recent symptoms.  Continue beta-blocker.  5 history of mitral valve  prolapse-not evident on last echocardiogram.  Kirk Ruths, MD

## 2018-05-04 ENCOUNTER — Encounter: Payer: Self-pay | Admitting: Cardiology

## 2018-05-04 ENCOUNTER — Ambulatory Visit (INDEPENDENT_AMBULATORY_CARE_PROVIDER_SITE_OTHER): Payer: Medicare Other | Admitting: Cardiology

## 2018-05-04 VITALS — BP 142/76 | HR 72 | Ht 66.0 in | Wt 187.0 lb

## 2018-05-04 DIAGNOSIS — I10 Essential (primary) hypertension: Secondary | ICD-10-CM

## 2018-05-04 DIAGNOSIS — E785 Hyperlipidemia, unspecified: Secondary | ICD-10-CM

## 2018-05-04 DIAGNOSIS — I2510 Atherosclerotic heart disease of native coronary artery without angina pectoris: Secondary | ICD-10-CM

## 2018-05-04 MED ORDER — ATORVASTATIN CALCIUM 20 MG PO TABS: 20.0000 mg | ORAL_TABLET | Freq: Every day | ORAL | 3 refills | Status: AC

## 2018-05-04 NOTE — Patient Instructions (Signed)
Medication Instructions:  NO CHANGE If you need a refill on your cardiac medications before your next appointment, please call your pharmacy.   Lab work: Your physician recommends that you return for lab work PRIOR TO EATING If you have labs (blood work) drawn today and your tests are completely normal, you will receive your results only by: . MyChart Message (if you have MyChart) OR . A paper copy in the mail If you have any lab test that is abnormal or we need to change your treatment, we will call you to review the results.  Follow-Up: At [facility], you and your health needs are our priority.  As part of our continuing mission to provide you with exceptional heart care, we have created designated Provider Care Teams.  These Care Teams include your primary Cardiologist (physician) and Advanced Practice Providers (APPs -  Physician Assistants and Nurse Practitioners) who all work together to provide you with the care you need, when you need it. You will need a follow up appointment in 12 months.  Please call our office 2 months in advance to schedule this appointment.  You may see Brian Crenshaw, MD or one of the following Advanced Practice Providers on your designated Care Team:   Luke Kilroy, PA-C Krista Kroeger, PA-C . Callie Goodrich, PA-C     

## 2018-05-05 DIAGNOSIS — E785 Hyperlipidemia, unspecified: Secondary | ICD-10-CM | POA: Diagnosis not present

## 2018-05-05 LAB — COMPREHENSIVE METABOLIC PANEL
ALBUMIN: 4.1 g/dL (ref 3.5–4.8)
ALK PHOS: 101 IU/L (ref 39–117)
ALT: 15 IU/L (ref 0–32)
AST: 18 IU/L (ref 0–40)
Albumin/Globulin Ratio: 1.5 (ref 1.2–2.2)
BUN/Creatinine Ratio: 20 (ref 12–28)
BUN: 15 mg/dL (ref 8–27)
Bilirubin Total: 0.3 mg/dL (ref 0.0–1.2)
CALCIUM: 9.5 mg/dL (ref 8.7–10.3)
CO2: 21 mmol/L (ref 20–29)
CREATININE: 0.75 mg/dL (ref 0.57–1.00)
Chloride: 103 mmol/L (ref 96–106)
GFR calc Af Amer: 93 mL/min/{1.73_m2} (ref >59)
GFR, EST NON AFRICAN AMERICAN: 81 mL/min/{1.73_m2} (ref >59)
GLOBULIN, TOTAL: 2.7 g/dL (ref 1.5–4.5)
GLUCOSE: 94 mg/dL (ref 65–99)
Potassium: 4.8 mmol/L (ref 3.5–5.2)
SODIUM: 141 mmol/L (ref 134–144)
Total Protein: 6.8 g/dL (ref 6.0–8.5)

## 2018-05-05 LAB — LIPID PANEL
Chol/HDL Ratio: 3.2 ratio (ref 0.0–4.4)
Cholesterol, Total: 164 mg/dL (ref 100–199)
HDL: 51 mg/dL (ref >39)
LDL Calculated: 82 mg/dL (ref 0–99)
Triglycerides: 156 mg/dL — ABNORMAL HIGH (ref 0–149)
VLDL Cholesterol Cal: 31 mg/dL (ref 5–40)

## 2018-05-18 ENCOUNTER — Encounter: Payer: Self-pay | Admitting: Family Medicine

## 2018-05-19 ENCOUNTER — Encounter: Payer: Self-pay | Admitting: Family Medicine

## 2018-05-19 MED ORDER — NITROFURANTOIN MONOHYD MACRO 100 MG PO CAPS: 100.0000 mg | ORAL_CAPSULE | Freq: Two times a day (BID) | ORAL | 0 refills | Status: AC

## 2018-05-24 NOTE — Progress Notes (Addendum)
[place] at [facility] [address], [place], [place] [address] [phone]  Date:  32/67/1245   Name:  Debra Burgess   DOB:  18-Nov-1945   MRN:  [id number]  PCP:  Darreld Mclean, MD    Chief Complaint: Urinary Tract Infection (back right sided pain, urinary urgency, 2nd round of antibioitcs-would like recheck)   History of Present Illness:  Debra Burgess is a 72 y.o. very pleasant female patient who presents with the following:  Here today with concern of persistent urinary sx after taking a course of macrobid at home  Recently seen by cardiology  1 coronary artery disease-patient denies chest pain.  Continue aspirin and statin. 2 hypertension-blood pressure borderline.  However typically controlled.  Continue present medications and follow.  Check potassium and renal function. 3 hyperlipidemia-continue statin.  Check if it and liver. 4 palpitations-no recent symptoms.  Continue beta-blocker. 5 history of mitral valve prolapse-not evident on last echocardiogram.  She would like to have her flu shot today- she was not sure if covered by medicare.   No fever  On thanksgiving she developed sx of UTI.  She started on some macrobid that she had on hand. Took almost a whole course but her last 3 pills got lost down the drain.  I called her in another rx and she thinks that she is clear However she just wanted to make sure that all is well The only thing she has still noted is mild pain in her right lower back She notes that she may have a similar pain when her UTI is starting, and just wanted to mention it.  However, this pain is not severe at this time.  No hematuria No abd pain, eating ok   She is having UTI on a semi- regular basis.  We added a vaginal estrogen which does seem to be helping    Patient Active Problem List   Diagnosis Date Noted  . EKG, abnormal 09/02/2016  . Chest pain 09/02/2016  . Nonspecific abnormal  finding in stool contents 11/28/2015  . Change in bowel habits 11/28/2015  . Gas 11/28/2015  . Bloating 11/28/2015  . Osteopenia 04/30/2015  . Palpitations 04/10/2015  . Hyperlipidemia 03/16/2015  . GERD (gastroesophageal reflux disease) 03/16/2015  . Mitral valve regurgitation 03/16/2015  . Essential hypertension 03/16/2015    Past Medical History:  Diagnosis Date  . Arthritis   . Cancer ([place])   . Diverticulosis   . Fibrocystic breast   . GERD (gastroesophageal reflux disease)   . Hiatal hernia   . History of chicken pox   . Hyperlipidemia   . Hypertension   . Mononucleosis    2/15  . MVP (mitral valve prolapse)   . Osteopenia 04/30/2015    Past Surgical History:  Procedure Laterality Date  . BREAST SURGERY Bilateral 2010   breast reduction  . CORONARY ANGIOGRAPHY  09/02/2016  . CYST EXCISION  06/2016   hyoid bone area per patient  . HIATAL HERNIA REPAIR  2015   "severe hiatal hernia repair", robotic repair with pig skin sheath  . LEFT HEART CATH AND CORONARY ANGIOGRAPHY N/A 09/02/2016   Procedure: Left Heart Cath and Coronary Angiography;  Surgeon: Leonie Man, MD;  Location: [facility];  Service: Cardiovascular;  Laterality: N/A;  . REDUCTION MAMMAPLASTY Bilateral    2011  . TONSILLECTOMY AND ADENOIDECTOMY  1952    Social History   Tobacco Use  .  Smoking status: Never Smoker  . Smokeless tobacco: Never Used  Substance Use Topics  . Alcohol use: No    Alcohol/week: 0.0 standard drinks    Comment: Rarely  . Drug use: No    Family History  Problem Relation Age of Onset  . Hypertension Mother   . Arthritis Mother   . Colon polyps Mother        had 2 surgeries  . Prostate cancer Father   . Arthritis Maternal Grandmother   . Diabetes Cousin        maternal  . Arthritis Sister   . Hypertension Sister   . Ovarian cancer Sister        ovarian and breast  . Hypertension Sister   . Breast cancer Sister     Allergies  Allergen Reactions   . Mango Flavor Anaphylaxis    Had reaction to the fruit.  . Lipitor [Atorvastatin] Other (See Comments)    Caused neuropathy to one half of the patient's face    Medication list has been reviewed and updated.  Current Outpatient Medications on File Prior to Visit  Medication Sig Dispense Refill  . acetaminophen (TYLENOL) 325 MG tablet Take 325 mg by mouth every 6 (six) hours as needed (for headaches or body pain).    Marland Kitchen aspirin 81 MG tablet Chew 81 mg by mouth daily.     Marland Kitchen atorvastatin (LIPITOR) 20 MG tablet Take 1 tablet (20 mg total) by mouth daily at 6 PM. 90 tablet 3  . Coenzyme Q10 (CO Q-10) 100 MG CAPS Take 100 mg by mouth 2 (two) times daily. GUMMIES    . docusate sodium (COLACE) 100 MG capsule Take 1 capsule (100 mg total) by mouth every 12 (twelve) hours. 60 capsule 0  . Estradiol 10 MCG TABS vaginal tablet Place 1 tablet vaginally 2x a week as needed to maintain vaginal comfort 24 tablet 3  . losartan (COZAAR) 50 MG tablet Take 1.5 tablets (75 mg total) by mouth daily. (Patient taking differently: Take 50 mg by mouth daily. ) 180 tablet 0  . metoprolol tartrate (LOPRESSOR) 50 MG tablet Take 75 mg by mouth 2 (two) times daily.    . nitroGLYCERIN (NITROSTAT) 0.4 MG SL tablet Place 1 tablet (0.4 mg total) under the tongue every 5 (five) minutes x 3 doses as needed for chest pain. 25 tablet 3  . Omega-3 Fatty Acids (FISH OIL) 1000 MG CAPS Take 1 capsule (1,000 mg total) by mouth 2 (two) times daily. (Patient taking differently: Take 1 capsule by mouth 2 (two) times daily. BURP-FREE GUMMIES) 180 capsule 3   No current facility-administered medications on file prior to visit.     Review of Systems:  As per HPI- otherwise negative. No CP or SOB No rash    Physical Examination: Vitals:   05/27/18 1107  BP: 128/72  Pulse: (!) 58  Resp: 16  Temp: 97.6 F (36.4 C)  SpO2: 95%   Vitals:   05/27/18 1107  Weight: 187 lb (84.8 kg)  Height: 5\' 6"  (1.676 m)   Body mass index is  30.18 kg/m. Ideal Body Weight: Weight in (lb) to have BMI = 25: 154.6  GEN: WDWN, NAD, Non-toxic, A & O x 3, looks well  HEENT: Atraumatic, Normocephalic. Neck supple. No masses, No LAD. Ears and Nose: No external deformity. CV: RRR, No M/G/R. No JVD. No thrill. No extra heart sounds. PULM: CTA B, no wheezes, crackles, rhonchi. No retractions. No resp. distress. No accessory muscle use.  ABD: S, NT, ND, +BS. No rebound. No HSM. Benign belly  EXTR: No c/c/e NEURO Normal gait.  PSYCH: Normally interactive. Conversant. Not depressed or anxious appearing.  Calm demeanor.  She indicates her right upper gluteal area as the location of intermittent mild pain.  However, nontender at this time.  No skin changes.   Assessment and Plan: Urinary urgency - Plan: Urine Culture, POCT urinalysis dipstick  Immunization due  Following up on recent possible UTI.  She has taken a course of Macrobid at home, and feels that she is better.  However she just wanted sure her urine culture was negative.  We will repeat culture for her today.  Also gave flu shot today. She has a urine cup at home, so she may bring [place] a urine culture in the future if she suspects a recurrent UTI.  Signed Lamar Blinks, MD 12/12 Urine culture received, negative Message to pt   Results for orders placed or performed in visit on 05/27/18  Urine Culture  Result Value Ref Range   MICRO NUMBER: [id number]    SPECIMEN QUALITY: Adequate    Sample Source URINE    STATUS: FINAL    Result:      Single organism less than 10,000 CFU/mL isolated. These organisms, commonly found on external and internal genitalia, are considered colonizers. No further testing performed.  POCT urinalysis dipstick  Result Value Ref Range   Color, UA yellow yellow   Clarity, UA cloudy (A) clear   Glucose, UA negative negative mg/dL   Bilirubin, UA negative negative   Ketones, POC UA negative negative mg/dL   Spec Grav, UA 1.020 1.010 - 1.025    Blood, UA negative negative   pH, UA 5.5 5.0 - 8.0   Protein Ur, POC trace (A) negative mg/dL   Urobilinogen, UA 0.2 0.2 or 1.0 E.U./dL   Nitrite, UA Negative Negative   Leukocytes, UA Negative Negative   Message to pt

## 2018-05-27 ENCOUNTER — Ambulatory Visit (INDEPENDENT_AMBULATORY_CARE_PROVIDER_SITE_OTHER): Payer: Medicare Other | Admitting: Family Medicine

## 2018-05-27 ENCOUNTER — Encounter: Payer: Self-pay | Admitting: Family Medicine

## 2018-05-27 VITALS — BP 128/72 | HR 58 | Temp 97.6°F | Resp 16 | Ht 66.0 in | Wt 187.0 lb

## 2018-05-27 DIAGNOSIS — R3915 Urgency of urination: Secondary | ICD-10-CM | POA: Diagnosis not present

## 2018-05-27 DIAGNOSIS — Z23 Encounter for immunization: Secondary | ICD-10-CM

## 2018-05-27 DIAGNOSIS — I2510 Atherosclerotic heart disease of native coronary artery without angina pectoris: Secondary | ICD-10-CM | POA: Diagnosis not present

## 2018-05-27 LAB — POCT URINALYSIS DIP (MANUAL ENTRY)
BILIRUBIN UA: NEGATIVE mg/dL
Bilirubin, UA: NEGATIVE
Blood, UA: NEGATIVE
Glucose, UA: NEGATIVE mg/dL
Leukocytes, UA: NEGATIVE
NITRITE UA: NEGATIVE
SPEC GRAV UA: 1.02 (ref 1.010–1.025)
Urobilinogen, UA: 0.2 E.U./dL
pH, UA: 5.5 (ref 5.0–8.0)

## 2018-05-27 NOTE — Patient Instructions (Addendum)
I will be in touch with your urine culture Asap You get your flu shot today. call if any concerns.

## 2018-05-28 ENCOUNTER — Encounter: Payer: Self-pay | Admitting: Family Medicine

## 2018-05-28 LAB — URINE CULTURE
MICRO NUMBER: 91483599
SPECIMEN QUALITY:: ADEQUATE

## 2018-06-15 ENCOUNTER — Encounter: Payer: Self-pay | Admitting: Family Medicine

## 2018-06-17 NOTE — Progress Notes (Addendum)
[facility] [place], [place], [place] [address] [phone]  Date:  09/18/345   Name:  Debra Burgess   DOB:  1946/04/09   MRN:  [id number]  PCP:  Darreld Mclean, MD    Chief Complaint: Abdominal Pain (gallbladder?, belching, worsening gerd, chills without fever, irregular bm, burning, family hx or gallbladder issues) and Back Ache   History of Present Illness:  Debra Burgess is a 73 y.o. very pleasant female patient who presents with the following:  History of hypertension, mitral valve regurg, GERD, hyperlipidemia, osteopenia Here today with concern of various symptoms I last saw her about 3 weeks ago with concern of UTI-vaginal estrogen has helped decrease her urinary tract infections however Her urine culture was negative at last visit  Today she notes concern about gallbladder disease She has noted sx for about 10 days.  At first she thought that she had a virus She notes belching, loose and more frequent stools, fatigue, chills Sx can be worse after eating- she is avoiding fatty foods but is not sure if this is helping She has used nexium off and on over the last 10 days, has not helped that much. She did try some tums but they did not help  Sx are worse when she lays down at night No CP or SOB No fever No vomiting, some nausea however  She notes that women in her family had gallbladder issues.  Her older sister had cholecystitis about 4 years ago. Her oldest daughter had a chole 6 months ago  We have never done a Gallbladder US for her that we can recall She just feels nauseated right now   She drank coffee at 11 am   Patient Active Problem List   Diagnosis Date Noted  . EKG, abnormal 09/02/2016  . Chest pain 09/02/2016  . Change in bowel habits 11/28/2015  . Osteopenia 04/30/2015  . Palpitations 04/10/2015  . Hyperlipidemia 03/16/2015  . GERD (gastroesophageal reflux disease) 03/16/2015  .  Mitral valve regurgitation 03/16/2015  . Essential hypertension 03/16/2015    Past Medical History:  Diagnosis Date  . Arthritis   . Cancer ([place])   . Diverticulosis   . Fibrocystic breast   . GERD (gastroesophageal reflux disease)   . Hiatal hernia   . History of chicken pox   . Hyperlipidemia   . Hypertension   . Mononucleosis    2/15  . MVP (mitral valve prolapse)   . Osteopenia 04/30/2015    Past Surgical History:  Procedure Laterality Date  . BREAST SURGERY Bilateral 2010   breast reduction  . CORONARY ANGIOGRAPHY  09/02/2016  . CYST EXCISION  06/2016   hyoid bone area per patient  . HIATAL HERNIA REPAIR  2015   "severe hiatal hernia repair", robotic repair with pig skin sheath  . LEFT HEART CATH AND CORONARY ANGIOGRAPHY N/A 09/02/2016   Procedure: Left Heart Cath and Coronary Angiography;  Surgeon: Leonie Man, MD;  Location: [facility];  Service: Cardiovascular;  Laterality: N/A;  . REDUCTION MAMMAPLASTY Bilateral    2011  . TONSILLECTOMY AND ADENOIDECTOMY  1952    Social History   Tobacco Use  . Smoking status: Never Smoker  . Smokeless tobacco: Never Used  Substance Use Topics  . Alcohol use: No    Alcohol/week: 0.0 standard drinks    Comment: Rarely  . Drug use: No    Family History  Problem Relation  Age of Onset  . Hypertension Mother   . Arthritis Mother   . Colon polyps Mother        had 2 surgeries  . Prostate cancer Father   . Arthritis Maternal Grandmother   . Diabetes Cousin        maternal  . Arthritis Sister   . Hypertension Sister   . Ovarian cancer Sister        ovarian and breast  . Hypertension Sister   . Breast cancer Sister     Allergies  Allergen Reactions  . Mango Flavor Anaphylaxis    Had reaction to the fruit.  . Lipitor [Atorvastatin] Other (See Comments)    Caused neuropathy to one half of the patient's face    Medication list has been reviewed and updated.  Current Outpatient Medications on File  Prior to Visit  Medication Sig Dispense Refill  . acetaminophen (TYLENOL) 325 MG tablet Take 325 mg by mouth every 6 (six) hours as needed (for headaches or body pain).    Marland Kitchen aspirin 81 MG tablet Chew 81 mg by mouth daily.     Marland Kitchen atorvastatin (LIPITOR) 20 MG tablet Take 1 tablet (20 mg total) by mouth daily at 6 PM. 90 tablet 3  . Coenzyme Q10 (CO Q-10) 100 MG CAPS Take 100 mg by mouth 2 (two) times daily. GUMMIES    . docusate sodium (COLACE) 100 MG capsule Take 1 capsule (100 mg total) by mouth every 12 (twelve) hours. 60 capsule 0  . Estradiol 10 MCG TABS vaginal tablet Place 1 tablet vaginally 2x a week as needed to maintain vaginal comfort 24 tablet 3  . metoprolol tartrate (LOPRESSOR) 50 MG tablet Take 75 mg by mouth 2 (two) times daily.    . nitroGLYCERIN (NITROSTAT) 0.4 MG SL tablet Place 1 tablet (0.4 mg total) under the tongue every 5 (five) minutes x 3 doses as needed for chest pain. 25 tablet 3  . Omega-3 Fatty Acids (FISH OIL) 1000 MG CAPS Take 1 capsule (1,000 mg total) by mouth 2 (two) times daily. (Patient taking differently: Take 1 capsule by mouth 2 (two) times daily. BURP-FREE GUMMIES) 180 capsule 3  . losartan (COZAAR) 50 MG tablet Take 1.5 tablets (75 mg total) by mouth daily. (Patient taking differently: Take 50 mg by mouth daily. ) 180 tablet 0   No current facility-administered medications on file prior to visit.     Review of Systems:  As per HPI- otherwise negative. No blood in her stool   Physical Examination: Vitals:   06/18/18 1450  BP: 114/80  Pulse: 62  Resp: 16  Temp: 97.8 F (36.6 C)  SpO2: 98%   Vitals:   06/18/18 1450  Weight: 185 lb 9.6 oz (84.2 kg)  Height: 5\' 6"  (1.676 m)   Body mass index is 29.96 kg/m. Ideal Body Weight: Weight in (lb) to have BMI = 25: 154.6  GEN: WDWN, NAD, Non-toxic, A & O x 3, overweight, looks well  HEENT: Atraumatic, Normocephalic. Neck supple. No masses, No LAD. Ears and Nose: No external deformity. CV: RRR, No  M/G/R. No JVD. No thrill. No extra heart sounds. PULM: CTA B, no wheezes, crackles, rhonchi. No retractions. No resp. distress. No accessory muscle use. ABD: S, ND, +BS. No rebound. No HSM.  Mild abd pain over her abd. Worse in the epigastric and LUQ EXTR: No c/c/e NEURO Normal gait.  PSYCH: Normally interactive. Conversant. Not depressed or anxious appearing.  Calm demeanor. '  EKG: low  voltage but no ST elevation or depression C/w EKG from 11/19 no acute change noted  Assessment and Plan: Epigastric pain - Plan: US Abdomen Limited RUQ, CBC, Comprehensive metabolic panel, Amylase, Lipase, EKG 12-Lead  Here today with concern of epigastric and generalized abdominal pain.  Patient is most concerned about her gallbladder.  Obtain ultrasound this evening as above, labs are also pending. EKG is mildly abnormal, but does not show any signs of acute ischemia.  Will obtain a troponin for patient due to her age  Signed Lamar Blinks, MD Ultrasound instructions: Go to imaging at 5:30 pm, [place] at 6pm   Received labs as below, await her ultrasound Results for orders placed or performed in visit on 06/18/18  CBC  Result Value Ref Range   WBC 13.5 (H) 4.0 - 10.5 K/uL   RBC 4.82 3.87 - 5.11 Mil/uL   Platelets 349.0 150.0 - 400.0 K/uL   Hemoglobin 14.0 12.0 - 15.0 g/dL   HCT 42.4 36.0 - 46.0 %   MCV 87.9 78.0 - 100.0 fl   MCHC 33.0 30.0 - 36.0 g/dL   RDW 14.0 11.5 - 15.5 %  Comprehensive metabolic panel  Result Value Ref Range   Sodium 138 135 - 145 mEq/L   Potassium 4.2 3.5 - 5.1 mEq/L   Chloride 104 96 - 112 mEq/L   CO2 26 19 - 32 mEq/L   Glucose, Bld 92 70 - 99 mg/dL   BUN 13 6 - 23 mg/dL   Creatinine, Ser 0.86 0.40 - 1.20 mg/dL   Total Bilirubin 0.4 0.2 - 1.2 mg/dL   Alkaline Phosphatase 98 39 - 117 U/L   AST 15 0 - 37 U/L   ALT 17 0 - 35 U/L   Total Protein 7.7 6.0 - 8.3 g/dL   Albumin 4.2 3.5 - 5.2 g/dL   Calcium 10.1 8.4 - 10.5 mg/dL   GFR 69.29 >60.00 mL/min  Amylase  Result  Value Ref Range   Amylase 35 27 - 131 U/L  Lipase  Result Value Ref Range   Lipase 20.0 11.0 - 59.0 U/L  Troponin I -  Result Value Ref Range   TNIDX 0.00 0.00 - 0.06 ug/l   US Abdomen Limited Ruq  Result Date: 06/18/2018 CLINICAL DATA:  Epigastric pain for 2 weeks.  Nausea. EXAM: ULTRASOUND ABDOMEN LIMITED RIGHT UPPER QUADRANT COMPARISON:  CT 07/24/2017.  No prior abdominal ultrasound. FINDINGS: Gallbladder: Physiologically distended with sludge and tiny gallstones. Gallbladder wall thickness is upper normal measuring 3-4 mm. No pericholecystic fluid. No sonographic Murphy sign noted by sonographer. Common bile duct: Diameter: 2 mm, normal. Liver: No focal lesion identified. Within normal limits in parenchymal echogenicity. Portal vein is patent on color Doppler imaging with normal direction of blood flow towards the liver. IMPRESSION: Gallbladder sludge and tiny stones without secondary findings of acute cholecystitis. No biliary dilatation. Electronically Signed   By: Keith Rake M.D.   On: 06/18/2018 19:11   Called patient left message on machine.  Labs are normal except for elevated white cell count.  Her gallbladder does not show acute cholecystitis, but does show gallstones and sludge.  We will send her my chart message with further details  06/19/2018 Called patient to discuss.  She is feeling a little bit better today.  We discussed her labs and ultrasound, she does not have acute cholecystitis but is likely having gallbladder symptoms. I put in an urgent referral to general surgery for her.  I have asked her to watch herself  carefully for any concerning symptoms.  If she is having severe persistent pain, vomiting, fever, or anything else that concerns her she will go to the emergency room.  Otherwise she will avoid eating fat so as to keep her gallbladder calm She also seems to have some element of GERD, so we will treat her with Protonix and Carafate in the meantime  Answered all  questions, she will keep me posted with any concerns

## 2018-06-18 ENCOUNTER — Encounter: Payer: Self-pay | Admitting: Family Medicine

## 2018-06-18 ENCOUNTER — Ambulatory Visit (HOSPITAL_BASED_OUTPATIENT_CLINIC_OR_DEPARTMENT_OTHER)
Admission: RE | Admit: 2018-06-18 | Discharge: 2018-06-18 | Disposition: A | Discharge status: Home / Self Care | Payer: Medicare Other | Source: Ambulatory Visit | Attending: Family Medicine | Admitting: Family Medicine

## 2018-06-18 ENCOUNTER — Ambulatory Visit (INDEPENDENT_AMBULATORY_CARE_PROVIDER_SITE_OTHER): Payer: Medicare Other | Admitting: Family Medicine

## 2018-06-18 VITALS — BP 114/80 | HR 62 | Temp 97.8°F | Resp 16 | Ht 66.0 in | Wt 185.6 lb

## 2018-06-18 DIAGNOSIS — R1013 Epigastric pain: Secondary | ICD-10-CM

## 2018-06-18 DIAGNOSIS — K8020 Calculus of gallbladder without cholecystitis without obstruction: Secondary | ICD-10-CM | POA: Diagnosis not present

## 2018-06-18 LAB — CBC
HEMATOCRIT: 42.4 % (ref 36.0–46.0)
HEMOGLOBIN: 14 g/dL (ref 12.0–15.0)
MCHC: 33 g/dL (ref 30.0–36.0)
MCV: 87.9 fl (ref 78.0–100.0)
Platelets: 349 10*3/uL (ref 150.0–400.0)
RBC: 4.82 Mil/uL (ref 3.87–5.11)
RDW: 14 % (ref 11.5–15.5)
WBC: 13.5 10*3/uL — AB (ref 4.0–10.5)

## 2018-06-18 LAB — COMPREHENSIVE METABOLIC PANEL
ALBUMIN: 4.2 g/dL (ref 3.5–5.2)
ALT: 17 U/L (ref 0–35)
AST: 15 U/L (ref 0–37)
Alkaline Phosphatase: 98 U/L (ref 39–117)
BILIRUBIN TOTAL: 0.4 mg/dL (ref 0.2–1.2)
BUN: 13 mg/dL (ref 6–23)
CO2: 26 mEq/L (ref 19–32)
Calcium: 10.1 mg/dL (ref 8.4–10.5)
Chloride: 104 mEq/L (ref 96–112)
Creatinine, Ser: 0.86 mg/dL (ref 0.40–1.20)
GFR: 69.29 mL/min (ref >60.00)
GLUCOSE: 92 mg/dL (ref 70–99)
POTASSIUM: 4.2 meq/L (ref 3.5–5.1)
Sodium: 138 mEq/L (ref 135–145)
TOTAL PROTEIN: 7.7 g/dL (ref 6.0–8.3)

## 2018-06-18 LAB — AMYLASE: AMYLASE: 35 U/L (ref 27–131)

## 2018-06-18 LAB — LIPASE: Lipase: 20 U/L (ref 11.0–59.0)

## 2018-06-18 LAB — TROPONIN I: TNIDX: 0 ug/L (ref 0.00–0.06)

## 2018-06-18 NOTE — Patient Instructions (Signed)
You certainly may have a gallbladder problem.  We are going to get an ultrasound of your gallbladder this evening, at 6 PM.  We do need you to fast a few more hours in order to get good pictures.  Please take nothing to eat or drink, and come back to the imaging department at 5:30 PM.  They will plan to do ultrasound at 6pm I will be in touch with your ultrasound report as it is possible. We will also do labs today, to look for any other concern We are running labs to check for any sign of infection, pancreatitis, or heart muscle distress. Your EKG looks okay

## 2018-06-19 MED ORDER — SUCRALFATE 1 G PO TABS: 1.0000 g | ORAL_TABLET | Freq: Three times a day (TID) | ORAL | 0 refills | Status: DC

## 2018-06-19 MED ORDER — PANTOPRAZOLE SODIUM 40 MG PO TBEC: 40.0000 mg | DELAYED_RELEASE_TABLET | Freq: Every day | ORAL | 1 refills | Status: DC

## 2018-06-19 NOTE — Addendum Note (Signed)
Addended by: Lamar Blinks C on: 06/19/2018 12:45 PM   Modules accepted: Orders

## 2018-06-26 ENCOUNTER — Other Ambulatory Visit: Payer: Self-pay | Admitting: Family Medicine

## 2018-06-26 DIAGNOSIS — R1013 Epigastric pain: Secondary | ICD-10-CM

## 2018-06-27 ENCOUNTER — Other Ambulatory Visit: Payer: Self-pay | Admitting: Cardiology

## 2018-07-01 ENCOUNTER — Encounter: Payer: Self-pay | Admitting: Family Medicine

## 2018-08-07 ENCOUNTER — Other Ambulatory Visit: Payer: Self-pay | Admitting: Family Medicine

## 2018-08-07 DIAGNOSIS — R1013 Epigastric pain: Secondary | ICD-10-CM

## 2018-09-04 ENCOUNTER — Other Ambulatory Visit: Payer: Self-pay | Admitting: *Deleted

## 2018-09-04 MED ORDER — METOPROLOL TARTRATE 50 MG PO TABS: 75.0000 mg | ORAL_TABLET | Freq: Two times a day (BID) | ORAL | 3 refills | Status: AC

## 2018-09-11 ENCOUNTER — Encounter: Payer: Self-pay | Admitting: Family Medicine

## 2018-09-17 DIAGNOSIS — L821 Other seborrheic keratosis: Secondary | ICD-10-CM | POA: Diagnosis not present

## 2018-09-17 DIAGNOSIS — L988 Other specified disorders of the skin and subcutaneous tissue: Secondary | ICD-10-CM | POA: Diagnosis not present

## 2018-09-17 DIAGNOSIS — L570 Actinic keratosis: Secondary | ICD-10-CM | POA: Diagnosis not present

## 2018-09-17 DIAGNOSIS — L814 Other melanin hyperpigmentation: Secondary | ICD-10-CM | POA: Diagnosis not present

## 2018-09-17 DIAGNOSIS — Z85828 Personal history of other malignant neoplasm of skin: Secondary | ICD-10-CM | POA: Diagnosis not present

## 2018-09-17 DIAGNOSIS — D485 Neoplasm of uncertain behavior of skin: Secondary | ICD-10-CM | POA: Diagnosis not present

## 2018-09-19 ENCOUNTER — Other Ambulatory Visit: Payer: Self-pay | Admitting: Family Medicine

## 2018-09-19 DIAGNOSIS — R1013 Epigastric pain: Secondary | ICD-10-CM

## 2018-10-07 ENCOUNTER — Other Ambulatory Visit: Payer: Self-pay | Admitting: Family Medicine

## 2018-10-07 DIAGNOSIS — R1013 Epigastric pain: Secondary | ICD-10-CM

## 2018-10-10 ENCOUNTER — Encounter: Payer: Self-pay | Admitting: Family Medicine

## 2018-10-11 ENCOUNTER — Other Ambulatory Visit: Payer: Self-pay

## 2018-10-11 ENCOUNTER — Emergency Department (HOSPITAL_COMMUNITY)
Admission: EM | Admit: 2018-10-11 | Discharge: 2018-10-11 | Disposition: A | Discharge status: Home / Self Care | Payer: Medicare Other | Attending: Emergency Medicine | Admitting: Emergency Medicine

## 2018-10-11 ENCOUNTER — Emergency Department (HOSPITAL_COMMUNITY): Payer: Medicare Other

## 2018-10-11 ENCOUNTER — Encounter (HOSPITAL_COMMUNITY): Payer: Self-pay | Admitting: Emergency Medicine

## 2018-10-11 DIAGNOSIS — Z79899 Other long term (current) drug therapy: Secondary | ICD-10-CM | POA: Insufficient documentation

## 2018-10-11 DIAGNOSIS — I10 Essential (primary) hypertension: Secondary | ICD-10-CM | POA: Diagnosis not present

## 2018-10-11 DIAGNOSIS — Z859 Personal history of malignant neoplasm, unspecified: Secondary | ICD-10-CM | POA: Diagnosis not present

## 2018-10-11 DIAGNOSIS — Z7982 Long term (current) use of aspirin: Secondary | ICD-10-CM | POA: Insufficient documentation

## 2018-10-11 DIAGNOSIS — I2510 Atherosclerotic heart disease of native coronary artery without angina pectoris: Secondary | ICD-10-CM | POA: Insufficient documentation

## 2018-10-11 DIAGNOSIS — R1011 Right upper quadrant pain: Secondary | ICD-10-CM | POA: Diagnosis not present

## 2018-10-11 DIAGNOSIS — R350 Frequency of micturition: Secondary | ICD-10-CM | POA: Insufficient documentation

## 2018-10-11 DIAGNOSIS — R1031 Right lower quadrant pain: Secondary | ICD-10-CM | POA: Diagnosis not present

## 2018-10-11 DIAGNOSIS — M791 Myalgia, unspecified site: Secondary | ICD-10-CM | POA: Diagnosis not present

## 2018-10-11 DIAGNOSIS — R109 Unspecified abdominal pain: Secondary | ICD-10-CM | POA: Diagnosis not present

## 2018-10-11 DIAGNOSIS — R52 Pain, unspecified: Secondary | ICD-10-CM

## 2018-10-11 LAB — COMPREHENSIVE METABOLIC PANEL
ALT: 20 U/L (ref 0–44)
AST: 22 U/L (ref 15–41)
Albumin: 3.7 g/dL (ref 3.5–5.0)
Alkaline Phosphatase: 93 U/L (ref 38–126)
Anion gap: 12 (ref 5–15)
BUN: 14 mg/dL (ref 8–23)
CO2: 21 mmol/L — ABNORMAL LOW (ref 22–32)
Calcium: 9.6 mg/dL (ref 8.9–10.3)
Chloride: 107 mmol/L (ref 98–111)
Creatinine, Ser: 0.93 mg/dL (ref 0.44–1.00)
GFR calc Af Amer: >60 mL/min (ref >60)
GFR calc non Af Amer: >60 mL/min (ref >60)
Glucose, Bld: 102 mg/dL — ABNORMAL HIGH (ref 70–99)
Potassium: 4.2 mmol/L (ref 3.5–5.1)
Sodium: 140 mmol/L (ref 135–145)
Total Bilirubin: 0.5 mg/dL (ref 0.3–1.2)
Total Protein: 7.6 g/dL (ref 6.5–8.1)

## 2018-10-11 LAB — CBC WITH DIFFERENTIAL/PLATELET
Abs Immature Granulocytes: 0.04 10*3/uL (ref 0.00–0.07)
Basophils Absolute: 0.1 10*3/uL (ref 0.0–0.1)
Basophils Relative: 1 %
Eosinophils Absolute: 0.2 10*3/uL (ref 0.0–0.5)
Eosinophils Relative: 2 %
HCT: 39.2 % (ref 36.0–46.0)
Hemoglobin: 12.6 g/dL (ref 12.0–15.0)
Immature Granulocytes: 0 %
Lymphocytes Relative: 26 %
Lymphs Abs: 3.5 10*3/uL (ref 0.7–4.0)
MCH: 29.4 pg (ref 26.0–34.0)
MCHC: 32.1 g/dL (ref 30.0–36.0)
MCV: 91.4 fL (ref 80.0–100.0)
Monocytes Absolute: 1.2 10*3/uL — ABNORMAL HIGH (ref 0.1–1.0)
Monocytes Relative: 9 %
Neutro Abs: 8.3 10*3/uL — ABNORMAL HIGH (ref 1.7–7.7)
Neutrophils Relative %: 62 %
Platelets: 335 10*3/uL (ref 150–400)
RBC: 4.29 MIL/uL (ref 3.87–5.11)
RDW: 13.1 % (ref 11.5–15.5)
WBC: 13.4 10*3/uL — ABNORMAL HIGH (ref 4.0–10.5)
nRBC: 0 % (ref 0.0–0.2)

## 2018-10-11 LAB — URINALYSIS, ROUTINE W REFLEX MICROSCOPIC
Bilirubin Urine: NEGATIVE
Glucose, UA: NEGATIVE mg/dL
Hgb urine dipstick: NEGATIVE
Ketones, ur: NEGATIVE mg/dL
Leukocytes,Ua: NEGATIVE
Nitrite: NEGATIVE
Protein, ur: NEGATIVE mg/dL
Specific Gravity, Urine: 1.004 — ABNORMAL LOW (ref 1.005–1.030)
pH: 5 (ref 5.0–8.0)

## 2018-10-11 LAB — LIPASE, BLOOD: Lipase: 43 U/L (ref 11–51)

## 2018-10-11 LAB — LACTIC ACID, PLASMA: Lactic Acid, Venous: 1.9 mmol/L (ref 0.5–1.9)

## 2018-10-11 MED ORDER — IOHEXOL 300 MG/ML  SOLN
100.0000 mL | Freq: Once | INTRAMUSCULAR | Status: AC | PRN
  Administered 2018-10-11: 100 mL via INTRAVENOUS

## 2018-10-11 MED ORDER — ONDANSETRON HCL 4 MG/2ML IJ SOLN
4.0000 mg | Freq: Once | INTRAMUSCULAR | Status: AC
  Administered 2018-10-11: 08:00:00 4 mg via INTRAVENOUS
  Filled 2018-10-11: qty 2

## 2018-10-11 NOTE — ED Notes (Signed)
Patient transported to CT 

## 2018-10-11 NOTE — ED Triage Notes (Signed)
Pt  Here from  Home with c/o low back pain and UTI type symptoms with frequency, but no burning on urination , no fevers

## 2018-10-11 NOTE — Discharge Instructions (Addendum)
You were seen in the ED for nausea, light-headedness, body aches, urinary frequency, right abdominal pain.   Labs, urinalysis, CT all were normal.    The cause of your symptoms is unclear.  This may be an early virus.  Rest, stay hydrated, take ibuprofen or acetaminophen for aches.   Monitor your symptoms closely in the next 48-72 hours.  Return to the emergency room for fever greater than 100, chest pain, shortness of breath, bad cough, localized abdominal pain to the right upper or lower areas, blood in urine or stool.   Call your doctor and schedule an ER follow up in the next 48-72 hours to ensure symptoms are improving. Discuss your ongoing acid reflux.

## 2018-10-11 NOTE — ED Notes (Signed)
Back from ct scan

## 2018-10-11 NOTE — ED Provider Notes (Signed)
[facility] EMERGENCY DEPARTMENT Provider Note   CSN: [id number] Arrival date & time: 10/11/18  8850    History   Chief Complaint No chief complaint on file.   HPI Debra Burgess is a 73 y.o. female with history of CAD, hypertension, hyperlipidemia, obesity, hiatal hernia, GERD, frequent UTIs is here for concern of UTI.  Symptoms began 7 to 9 days ago.  Reports urinary frequency.  She took an Azo UTI test that was negative for nitrites but was very bright pink suggesting a lot of leukocytes.  She has been taking Azo daily.  She took ibuprofen last night.  Reports associated generalized malaise, "stiffness" around her back and legs, sweats, clamminess, low-grade fever up to 99 last night, lightheadedness, nausea.  She has right lower abdominal pain that radiates to her back bilaterally R>L  This pain is constant, not worse with moving or eating.  She has had 4 UTIs in the last year she feels like this time it is particularly worse.  She has been doing a lot of yard work over the last 4 days and thinks some of her body aches and stiffness may be related to this.  Some looser stools but no frank diarrhea, melena.  No vomiting, dysuria, hematuria, abnormal vaginal discharge or bleeding.  She has no history of kidney infections or stones.  Last antibiotics for UTI was 10 months ago.  No abdominal surgeries.   Blood pressure elevated here but states that when she is sick her blood pressure usually is higher, she has been compliant with her BP medicines last took this morning.  No HA, vision changes, CP, SOB.     No congestion, cough, CP, SOB, sick contacts, recent travel, exposure to confirmed/suspected COVID. HPI  Past Medical History:  Diagnosis Date   Arthritis    Cancer ([place])    Diverticulosis    Fibrocystic breast    GERD (gastroesophageal reflux disease)    Hiatal hernia    History of chicken pox    Hyperlipidemia    Hypertension    Mononucleosis    2/15     MVP (mitral valve prolapse)    Osteopenia 04/30/2015    Patient Active Problem List   Diagnosis Date Noted   EKG, abnormal 09/02/2016   Chest pain 09/02/2016   Change in bowel habits 11/28/2015   Osteopenia 04/30/2015   Palpitations 04/10/2015   Hyperlipidemia 03/16/2015   GERD (gastroesophageal reflux disease) 03/16/2015   Mitral valve regurgitation 03/16/2015   Essential hypertension 03/16/2015    Past Surgical History:  Procedure Laterality Date   BREAST SURGERY Bilateral 2010   breast reduction   CORONARY ANGIOGRAPHY  09/02/2016   CYST EXCISION  06/2016   hyoid bone area per patient   HIATAL HERNIA REPAIR  2015   "severe hiatal hernia repair", robotic repair with pig skin sheath   LEFT HEART CATH AND CORONARY ANGIOGRAPHY N/A 09/02/2016   Procedure: Left Heart Cath and Coronary Angiography;  Surgeon: Leonie Man, MD;  Location: [facility];  Service: Cardiovascular;  Laterality: N/A;   REDUCTION MAMMAPLASTY Bilateral    2011   TONSILLECTOMY AND ADENOIDECTOMY  1952     OB History   No obstetric history on file.      Home Medications    Prior to Admission medications   Medication Sig Start Date End Date Taking? Authorizing Provider  acetaminophen (TYLENOL) 325 MG tablet Take 325 mg by mouth every 6 (six) hours as needed for mild pain.  Yes [provider]  aspirin 81 MG tablet Take 81 mg by mouth once a week.    Yes [provider]  Coenzyme Q10 (CO Q-10) 100 MG CAPS Take 100 mg by mouth every Monday, Wednesday, and Friday. GUMMIES    Yes [provider]  docusate sodium (COLACE) 100 MG capsule Take 1 capsule (100 mg total) by mouth every 12 (twelve) hours. 08/11/17  Yes Ward, Delice Bison, DO  Estradiol 10 MCG TABS vaginal tablet Place 1 tablet vaginally 2x a week as needed to maintain vaginal comfort Patient taking differently: Place 1 tablet vaginally once a week. as needed to maintain vaginal comfort 01/22/18   Yes Copland, Gay Filler, MD  losartan (COZAAR) 50 MG tablet TAKE ONE AND ONE-HALF (1.5) TABLETS BY MOUTH DAILY Patient taking differently: Take 75 mg by mouth daily.  06/29/18  Yes Lelon Perla, MD  metoprolol tartrate (LOPRESSOR) 50 MG tablet Take 1.5 tablets (75 mg total) by mouth 2 (two) times daily. Patient taking differently: Take 50 mg by mouth 2 (two) times daily.  09/04/18  Yes Lelon Perla, MD  nitroGLYCERIN (NITROSTAT) 0.4 MG SL tablet Place 1 tablet (0.4 mg total) under the tongue every 5 (five) minutes x 3 doses as needed for chest pain. 05/13/17  Yes Mabe, Forbes Cellar, MD  Omega-3 Fatty Acids (FISH OIL) 1000 MG CAPS Take 1 capsule (1,000 mg total) by mouth 2 (two) times daily. Patient taking differently: Take 1 capsule by mouth 2 (two) times daily. BURP-FREE GUMMIES 04/10/15  Yes Lelon Perla, MD  pantoprazole (PROTONIX) 40 MG tablet TAKE ONE TABLET BY MOUTH DAILY Patient taking differently: Take 40 mg by mouth daily.  08/07/18  Yes Copland, Gay Filler, MD  sucralfate (CARAFATE) 1 g tablet TAKE ONE TABLET BY MOUTH FOUR TIMES A DAY WITH MEALS AND AT BEDTIME Patient taking differently: Take 1 g by mouth 4 (four) times daily -  with meals and at bedtime.  10/09/18  Yes Copland, Gay Filler, MD  atorvastatin (LIPITOR) 20 MG tablet Take 1 tablet (20 mg total) by mouth daily at 6 PM. Patient not taking: Reported on 10/11/2018 05/04/18   Lelon Perla, MD    Family History Family History  Problem Relation Age of Onset   Hypertension Mother    Arthritis Mother    Colon polyps Mother        had 2 surgeries   Prostate cancer Father    Arthritis Maternal Grandmother    Diabetes Cousin        maternal   Arthritis Sister    Hypertension Sister    Ovarian cancer Sister        ovarian and breast   Hypertension Sister    Breast cancer Sister     Social History Social History   Tobacco Use   Smoking status: Never Smoker   Smokeless tobacco: Never Used    Substance Use Topics   Alcohol use: No    Alcohol/week: 0.0 standard drinks    Comment: Rarely   Drug use: No     Allergies   Mango flavor and Lipitor [atorvastatin]   Review of Systems Review of Systems  Constitutional: Positive for chills and fever.       Generalized malaise   Gastrointestinal: Positive for abdominal pain and nausea.  Genitourinary: Positive for difficulty urinating and frequency.  Musculoskeletal: Positive for myalgias.  All other systems reviewed and are negative.    Physical Exam Updated Vital Signs BP 111/84  Pulse (!) 58    Temp 98 F (36.7 C) (Oral)    Resp 20    LMP 10/15/1997    SpO2 96%   Physical Exam Vitals signs and nursing note reviewed.  Constitutional:      Appearance: She is well-developed. She is diaphoretic.     Comments: Non toxic in NAD, a little diaphoretic on forehead  HENT:     Head: Normocephalic and atraumatic.     Nose: Nose normal.     Mouth/Throat:     Comments: MMM Eyes:     Conjunctiva/sclera: Conjunctivae normal.  Neck:     Musculoskeletal: Normal range of motion.  Cardiovascular:     Rate and Rhythm: Normal rate and regular rhythm.     Comments: 1+ radial and DP pulses bilaterally. No LE edema  Pulmonary:     Effort: Pulmonary effort is normal.     Breath sounds: Normal breath sounds.  Abdominal:     General: Bowel sounds are normal.     Palpations: Abdomen is soft.     Tenderness: There is abdominal tenderness. There is guarding.     Comments: Obese abdomen. Right mid/lateral tenderness with guarding. No RUQ or RLQ tenderness. Negative Murphy's and McBurney's. No suprapubic or CVA tenderness. No obvious distention, rigidity, peritonitis. No hernia. No pulsatility.   Musculoskeletal: Normal range of motion.  Skin:    General: Skin is warm.     Capillary Refill: Capillary refill takes less than 2 seconds.  Neurological:     Mental Status: She is alert.  Psychiatric:        Behavior: Behavior normal.       ED Treatments / Results  Labs (all labs ordered are listed, but only abnormal results are displayed) Labs Reviewed  URINALYSIS, ROUTINE W REFLEX MICROSCOPIC - Abnormal; Notable for the following components:      Result Value   Specific Gravity, Urine 1.004 (*)    All other components within normal limits  CBC WITH DIFFERENTIAL/PLATELET - Abnormal; Notable for the following components:   WBC 13.4 (*)    Neutro Abs 8.3 (*)    Monocytes Absolute 1.2 (*)    All other components within normal limits  COMPREHENSIVE METABOLIC PANEL - Abnormal; Notable for the following components:   CO2 21 (*)    Glucose, Bld 102 (*)    All other components within normal limits  URINE CULTURE  LACTIC ACID, PLASMA  LIPASE, BLOOD  LACTIC ACID, PLASMA    EKG None  Radiology Ct Abdomen Pelvis W Contrast  Result Date: 10/11/2018 CLINICAL DATA:  Right lower quadrant pain radiating into back. UTI like symptoms with frequency. EXAM: CT ABDOMEN AND PELVIS WITH CONTRAST TECHNIQUE: Multidetector CT imaging of the abdomen and pelvis was performed using the standard protocol following bolus administration of intravenous contrast. CONTRAST:  110mL OMNIPAQUE IOHEXOL 300 MG/ML  SOLN COMPARISON:  July 24, 2017 FINDINGS: Lower chest: There is a small hiatal hernia. The lower chest is otherwise normal. Hepatobiliary: No focal liver abnormality is seen. No gallstones, gallbladder wall thickening, or biliary dilatation. Pancreas: Unremarkable. No pancreatic ductal dilatation or surrounding inflammatory changes. Spleen: Normal in size without focal abnormality. Adrenals/Urinary Tract: Adrenal glands are unremarkable. Kidneys are normal, without renal calculi, focal lesion, or hydronephrosis. Bladder is unremarkable. Stomach/Bowel: There is a small hiatal hernia. The stomach is otherwise normal. The small bowel is normal. Colonic diverticuli are seen with no convincing evidence of acute diverticulitis. Colon is normal in  appearance. The appendix is  normal. Vascular/Lymphatic: No significant vascular findings are present. No enlarged abdominal or pelvic lymph nodes. Reproductive: Uterus and bilateral adnexa are unremarkable. Other: There is a fat containing ventral hernia on series 3, image 32. No free air free fluid. Musculoskeletal: No acute or significant osseous findings. IMPRESSION: 1. No cause for the patient's right lower quadrant pain identified. No renal or ureteral stones. No appendicitis. 2. Colonic diverticulosis without diverticulitis. 3. Fat containing ventral hernia. 4. Small hiatal hernia. Electronically Signed   By: Dorise Bullion III M.D   On: 10/11/2018 10:28    Procedures Procedures (including critical care time)  Medications Ordered in ED Medications  ondansetron [facility]) injection 4 mg (4 mg Intravenous Given 10/11/18 0812)  iohexol (OMNIPAQUE) 300 MG/ML solution 100 mL (100 mLs Intravenous Contrast Given 10/11/18 0920)     Initial Impression / Assessment and Plan / ED Course  I have reviewed the triage vital signs and the nursing notes.  Pertinent labs & imaging results that were available during my care of the patient were reviewed by me and considered in my medical decision making (see chart for details).  Clinical Course as of Oct 11 1054  Sun Oct 11, 2018  0848 WBC(!): 13.4 [CG]  1052 NEUT#(!): 8.3 [CG]  1052 Monocyte #(!): 1.2 [CG]    Clinical Course User Index [CG] Kinnie Feil, PA-C       ddx includes viral process vs R sided diverticulitis vs UTI/pyelonephritis.  Negative Murphy's and McBurney's. No CVAT. No pulsatility. No peritonitis. No pulse deficits or distal paresthesias, CP, SOB.  I have lower suspicion for dissection, SBO, AAA, perforated viscus, mesenteric ischemia. Given constitutional symptoms, age, risk will get labs, UA, CTAP. Afebrile without SIRS criteria for now.   0945:  Labs remarkable for WBC 13.4, neut and monocytes minimally elevated.  Possible  early viral process.  UA and CTAP vastly reassuring.  Repeat abd exam improved, VSS. Pt feels better after zofran.  Final Clinical Impressions(s) / ED Diagnoses   Discussed results and possibility of early virus.  Deemed appropriate for discharge with symptomatic care and close obs of her symptoms. I encouraged her to f/u with PCP in 48-72 hr to ensure no clinical deterioration.  She mentioned ongoing acid reflux symptoms, encouraged her to take her PPI early in AM on empty stomach, modify diet, may benefit from GI follow up or med changes will defer to PCP. Negative murphy's, normal LFT and gall bladder on CTAP here. Return precautions given.  Final diagnoses:  Body aches  Urinary frequency  Right lateral abdominal pain    ED Discharge Orders    None       Arlean Hopping 10/11/18 1056    Jola Schmidt, MD 10/12/18 2126

## 2018-10-12 LAB — URINE CULTURE: Culture: <10000 — AB

## 2018-10-15 ENCOUNTER — Other Ambulatory Visit: Payer: Self-pay

## 2018-10-15 ENCOUNTER — Telehealth: Payer: Self-pay | Admitting: Cardiology

## 2018-10-15 DIAGNOSIS — I208 Other forms of angina pectoris: Secondary | ICD-10-CM

## 2018-10-15 MED ORDER — NITROGLYCERIN 0.4 MG SL SUBL: 0.4000 mg | SUBLINGUAL_TABLET | SUBLINGUAL | 3 refills | Status: DC | PRN

## 2018-10-15 MED ORDER — NITROGLYCERIN 0.4 MG SL SUBL: 0.4000 mg | SUBLINGUAL_TABLET | SUBLINGUAL | 3 refills | Status: AC | PRN

## 2018-10-15 NOTE — Telephone Encounter (Signed)
Called patient back about her message. Patient complaining about lightheadedness, dizziness, some SOB at times, and heartburn/chest pain that radiates to her right lower back. Patient recently seen in ED on 4/26 for possible UTI and GI issues. Patient has history of Mitral valve prolapse and she thinks this might be causing her issues. Put patient on schedule with Dr. Stanford Breed on Monday, since he is DOD, for office visit. Will forward to Fabian Sharp PA for further advisement.     Cardiac Questionnaire:    Since your last visit or hospitalization:    1. Have you been having new or worsening chest pain? Yes   2. Have you been having new or worsening shortness of breath? Slight SOB 3. Have you been having new or worsening leg swelling, wt gain, or increase in abdominal girth (pants fitting more tightly)? No, BLE feel heavy   4. Have you had any passing out spells? No, but lightheadedness    *A YES to any of these questions would result in the appointment being kept. *If all the answers to these questions are NO, we should indicate that given the current situation regarding the worldwide coronarvirus pandemic, at the recommendation of the CDC, we are looking to limit gatherings in our waiting area, and thus will reschedule their appointment beyond four weeks from today.   _____________   [id number] Pre-Screening Questions:  . Do you currently have a fever? No, but 99 in emergency room on 4/26 . Have you recently travelled on a cruise, internationally, or to [place], [place], [place], [place], [place], or [place], [place] [employer]) ? NO . Have you been in contact with someone that is currently pending confirmation of Covid19 testing or has been confirmed to have the [place] virus? No, not that she knows, live in high rise condo . Are you currently experiencing fatigue or cough? Fatigue all the time, cough sometimes due to allergies

## 2018-10-15 NOTE — Telephone Encounter (Signed)
Message received from Triage. I returned patient call.  Pt complains of lightheadedness, dizziness, and morning nausea. Pain in lower abdomen. She was told in the ER that she likely had a viral illness, no evidence of UTI. She is quite fatigued. She continues to have "heartburn on the left side." She has taken nitro with some relief. She has been doing heavy landscaping at her daughter's house - this doesn't cause chest pain. The PPI and carafate prescribed by her PCP have not helped. She does not want to return to the ER at this time. She prefers to wait until her appt with Dr. Stanford Breed on Monday. We discussed when she should call our office back for an earlier appt and when she should report to the ER.  I will send my note to Dr. Stanford Breed.

## 2018-10-15 NOTE — Telephone Encounter (Signed)
° ° °  Pt c/o of Chest Pain: STAT if CP now or developed within 24 hours  1. Are you having CP right now? No  2. Are you experiencing any other symptoms (ex. SOB, nausea, vomiting, sweating)? dizziness  3. How long have you been experiencing CP?1 week  4. Is your CP continuous or coming and going? Coming and going  5. Have you taken Nitroglycerin? no ?

## 2018-10-15 NOTE — Telephone Encounter (Signed)
New message  Patient states that she spoke with Fabian Sharp per the prior message. The patient is having deep muscle pain in the underside of left arm and heartburn. I tried to contact triage, not available at this time. Please call the patient.

## 2018-10-15 NOTE — Telephone Encounter (Signed)
° ° ° °*  STAT* If patient is at the pharmacy, call can be transferred to refill team.   1. Which medications need to be refilled? (please list name of each medication and dose if known) nitroGLYCERIN (NITROSTAT) 0.4 MG SL tablet  2. Which pharmacy/location (including street and city if local pharmacy) is medication to be sent to?[facility]  3. Do they need a 30 day or 90 day supply?

## 2018-10-16 NOTE — Progress Notes (Signed)
HPI: FU mitral valve prolapse. Echocardiogram in 2012 showed normal LV function with no significant valvular heart disease. Carotid Dopplers 2012 showed minimal internal carotid artery disease bilaterally. Cath 3/18 showed 35 LAD, 45 Lcx, 60 ramus and hyperdynamic LV function. Since last seen,  over the past 6 weeks she has had intermittent chest pain.  It is in the left breast area and described as a burning sensation.  It can occur both at rest and with activities.  It is not pleuritic or necessarily related to food.  It is not exertional.  Last anywhere from 10 minutes to 1 hour.  No associated symptoms.  Has had some pain in left and right arm but unclear if that is related to physical activities as she is doing some landscaping.  There is no dyspnea.  Occasional palpitations.  No syncope.  Current Outpatient Medications  Medication Sig Dispense Refill  . acetaminophen (TYLENOL) 325 MG tablet Take 325 mg by mouth every 6 (six) hours as needed for mild pain.     Marland Kitchen aspirin 81 MG tablet Take 81 mg by mouth once a week.     Marland Kitchen atorvastatin (LIPITOR) 20 MG tablet Take 1 tablet (20 mg total) by mouth daily at 6 PM. 90 tablet 3  . Coenzyme Q10 (CO Q-10) 100 MG CAPS Take 100 mg by mouth every Monday, Wednesday, and Friday. GUMMIES     . Estradiol 10 MCG TABS vaginal tablet Place 1 tablet vaginally 2x a week as needed to maintain vaginal comfort (Patient taking differently: Place 1 tablet vaginally once a week. as needed to maintain vaginal comfort) 24 tablet 3  . losartan (COZAAR) 50 MG tablet TAKE ONE AND ONE-HALF (1.5) TABLETS BY MOUTH DAILY (Patient taking differently: Take 75 mg by mouth daily. ) 180 tablet 0  . metoprolol tartrate (LOPRESSOR) 50 MG tablet Take 1.5 tablets (75 mg total) by mouth 2 (two) times daily. (Patient taking differently: Take 50 mg by mouth 2 (two) times daily. ) 270 tablet 3  . nitroGLYCERIN (NITROSTAT) 0.4 MG SL tablet Place 1 tablet (0.4 mg total) under the tongue every  5 (five) minutes x 3 doses as needed for chest pain. 25 tablet 3  . Omega-3 Fatty Acids (FISH OIL) 1000 MG CAPS Take 1 capsule (1,000 mg total) by mouth 2 (two) times daily. (Patient taking differently: Take 1 capsule by mouth 2 (two) times daily. BURP-FREE GUMMIES) 180 capsule 3  . pantoprazole (PROTONIX) 40 MG tablet TAKE ONE TABLET BY MOUTH DAILY (Patient taking differently: Take 40 mg by mouth daily. ) 30 tablet 5  . sucralfate (CARAFATE) 1 g tablet TAKE ONE TABLET BY MOUTH FOUR TIMES A DAY WITH MEALS AND AT BEDTIME (Patient taking differently: Take 1 g by mouth 4 (four) times daily -  with meals and at bedtime. ) 120 tablet 3  . docusate sodium (COLACE) 100 MG capsule Take 1 capsule (100 mg total) by mouth every 12 (twelve) hours. (Patient not taking: Reported on 10/19/2018) 60 capsule 0   No current facility-administered medications for this visit.      Past Medical History:  Diagnosis Date  . Arthritis   . Cancer ([place])   . Diverticulosis   . Fibrocystic breast   . GERD (gastroesophageal reflux disease)   . Hiatal hernia   . History of chicken pox   . Hyperlipidemia   . Hypertension   . Mononucleosis    2/15  . MVP (mitral valve prolapse)   .  Osteopenia 04/30/2015    Past Surgical History:  Procedure Laterality Date  . BREAST SURGERY Bilateral 2010   breast reduction  . CORONARY ANGIOGRAPHY  09/02/2016  . CYST EXCISION  06/2016   hyoid bone area per patient  . HIATAL HERNIA REPAIR  2015   "severe hiatal hernia repair", robotic repair with pig skin sheath  . LEFT HEART CATH AND CORONARY ANGIOGRAPHY N/A 09/02/2016   Procedure: Left Heart Cath and Coronary Angiography;  Surgeon: Leonie Man, MD;  Location: [facility];  Service: Cardiovascular;  Laterality: N/A;  . REDUCTION MAMMAPLASTY Bilateral    2011  . TONSILLECTOMY AND ADENOIDECTOMY  1952    Social History   Socioeconomic History  . Marital status: Single    Spouse name: Not on file  . Number of  children: 4  . Years of education: Not on file  . Highest education level: Not on file  Occupational History  . Occupation: Pharmacist, hospital  Social Needs  . Financial resource strain: Not on file  . Food insecurity:    Worry: Not on file    Inability: Not on file  . Transportation needs:    Medical: Not on file    Non-medical: Not on file  Tobacco Use  . Smoking status: Never Smoker  . Smokeless tobacco: Never Used  Substance and Sexual Activity  . Alcohol use: No    Alcohol/week: 0.0 standard drinks    Comment: Rarely  . Drug use: No  . Sexual activity: Never  Lifestyle  . Physical activity:    Days per week: Not on file    Minutes per session: Not on file  . Stress: Not on file  Relationships  . Social connections:    Talks on phone: Not on file    Gets together: Not on file    Attends religious service: Not on file    Active member of club or organization: Not on file    Attends meetings of clubs or organizations: Not on file    Relationship status: Not on file  . Intimate partner violence:    Fear of current or ex partner: Not on file    Emotionally abused: Not on file    Physically abused: Not on file    Forced sexual activity: Not on file  Other Topics Concern  . Not on file  Social History Narrative   Recently moved to be near her daughter Mosetta Putt.     Has 4 children   Teaches latin Youth worker) Technology, STEM (middle school) Writer at [employer]   Completed masters degree   Divorced     Family History  Problem Relation Age of Onset  . Hypertension Mother   . Arthritis Mother   . Colon polyps Mother        had 2 surgeries  . Prostate cancer Father   . Arthritis Maternal Grandmother   . Diabetes Cousin        maternal  . Arthritis Sister   . Hypertension Sister   . Ovarian cancer Sister        ovarian and breast  . Hypertension Sister   . Breast cancer Sister     ROS: no fevers or chills, productive cough, hemoptysis,  dysphasia, odynophagia, melena, hematochezia, dysuria, hematuria, rash, seizure activity, orthopnea, PND, pedal edema, claudication. Remaining systems are negative.  Physical Exam: Well-developed well-nourished in no acute distress.  Skin is warm and dry.  HEENT is normal.  Neck is supple.  Chest is clear to auscultation with normal expansion.  Cardiovascular exam is regular rate and rhythm.  Abdominal exam nontender or distended. No masses palpated. Extremities show no edema. neuro grossly intact  ECG-normal sinus rhythm at a rate of 64.  No ST changes.  Personally reviewed  A/P  1 Coronary artery disease-plan to continue medical therapy with aspirin and statin.  2 hypertension-patient's blood pressure is elevated.  Increase losartan to 100 mg daily.  Follow blood pressure and advance regimen as needed.  3 chest pain-symptoms are somewhat atypical.  Question related to reflux though she states this is different than her previous symptoms.  Her symptoms are not exertional and her electrocardiogram is normal.  I will arrange an exercise treadmill for risk stratification and an echocardiogram to assess LV function in 6 to 8 weeks.  4 hyperlipidemia-continue statin.   5 palpitations-some symptoms.  Continue beta-blocker and follow..  6 question history of mitral valve prolapse-not evident on last echocardiogram. Repeat study.  Kirk Ruths, MD

## 2018-10-16 NOTE — Telephone Encounter (Signed)
Spoke with pt who state yesterday after talking Angie Duke, PA, she developed pain in her left arm but no other symptoms. She report she did take 3 nitro because she was worried it was related to her hear and opted not to go to the ED. Pt state pain was relieved and today she feels much better. She report the symptoms could be related to all the yard work she's been doing lately. Pt states she will continue with plan to attend virtual visit with Dr. Stanford Breed Monday morning and voiced understanding when she need to report to ED.

## 2018-10-19 ENCOUNTER — Encounter: Payer: Self-pay | Admitting: Cardiology

## 2018-10-19 ENCOUNTER — Telehealth: Payer: Self-pay

## 2018-10-19 ENCOUNTER — Other Ambulatory Visit: Payer: Self-pay

## 2018-10-19 ENCOUNTER — Ambulatory Visit (INDEPENDENT_AMBULATORY_CARE_PROVIDER_SITE_OTHER): Payer: Medicare Other | Admitting: Cardiology

## 2018-10-19 VITALS — BP 158/92 | HR 64 | Resp 12 | Ht 65.5 in | Wt 172.4 lb

## 2018-10-19 DIAGNOSIS — I10 Essential (primary) hypertension: Secondary | ICD-10-CM | POA: Diagnosis not present

## 2018-10-19 DIAGNOSIS — I340 Nonrheumatic mitral (valve) insufficiency: Secondary | ICD-10-CM | POA: Diagnosis not present

## 2018-10-19 DIAGNOSIS — R072 Precordial pain: Secondary | ICD-10-CM

## 2018-10-19 DIAGNOSIS — I208 Other forms of angina pectoris: Secondary | ICD-10-CM | POA: Diagnosis not present

## 2018-10-19 MED ORDER — LOSARTAN POTASSIUM 100 MG PO TABS: 100.0000 mg | ORAL_TABLET | Freq: Every day | ORAL | 3 refills | Status: AC

## 2018-10-19 NOTE — Patient Instructions (Signed)
Medication Instructions:  INCREASE LOSARTAN TO 100 MG ONCE DAILY If you need a refill on your cardiac medications before your next appointment, please call your pharmacy.   Lab work: If you have labs (blood work) drawn today and your tests are completely normal, you will receive your results only by: Marland Kitchen MyChart Message (if you have MyChart) OR . A paper copy in the mail If you have any lab test that is abnormal or we need to change your treatment, we will call you to review the results.  Testing/Procedures: Your physician has requested that you have an exercise tolerance test. For further information please visit [email or web address]. Please also follow instruction sheet, as given. SCHEDULE IN 6 - 8 WEEKS  Your physician has requested that you have an echocardiogram. Echocardiography is a painless test that uses sound waves to create images of your heart. It provides your doctor with information about the size and shape of your heart and how well your heart's chambers and valves are working. This procedure takes approximately one hour. There are no restrictions for this procedure.  SCHEDULE IN 6-8 WEEKS    Follow-Up: At [facility], you and your health needs are our priority.  As part of our continuing mission to provide you with exceptional heart care, we have created designated Provider Care Teams.  These Care Teams include your primary Cardiologist (physician) and Advanced Practice Providers (APPs -  Physician Assistants and Nurse Practitioners) who all work together to provide you with the care you need, when you need it. Your physician recommends that you schedule a follow-up appointment in: 3-4 MONTHS WITH DR Stanford Breed

## 2018-10-19 NOTE — Addendum Note (Signed)
Addended by: Cristopher Estimable on: 10/19/2018 09:19 AM   Modules accepted: Orders

## 2018-10-20 ENCOUNTER — Encounter: Payer: Self-pay | Admitting: Family Medicine

## 2018-11-01 ENCOUNTER — Other Ambulatory Visit: Payer: Self-pay | Admitting: Cardiology

## 2018-11-11 ENCOUNTER — Other Ambulatory Visit (HOSPITAL_BASED_OUTPATIENT_CLINIC_OR_DEPARTMENT_OTHER): Payer: Self-pay | Admitting: Family Medicine

## 2018-11-11 DIAGNOSIS — Z1231 Encounter for screening mammogram for malignant neoplasm of breast: Secondary | ICD-10-CM

## 2018-11-29 IMAGING — MG DIGITAL SCREENING BILATERAL MAMMOGRAM WITH TOMO AND CAD
6 of 10 series · 6 of 30 positions shown · non-contrast
Comparison: Previous exam(s).

ACR Breast Density Category a: The breast tissue is almost entirely
fatty.

CLINICAL DATA: Screening.

EXAM:
DIGITAL SCREENING BILATERAL MAMMOGRAM WITH TOMO AND CAD

[R MLO synth-2D (1 of 2)]
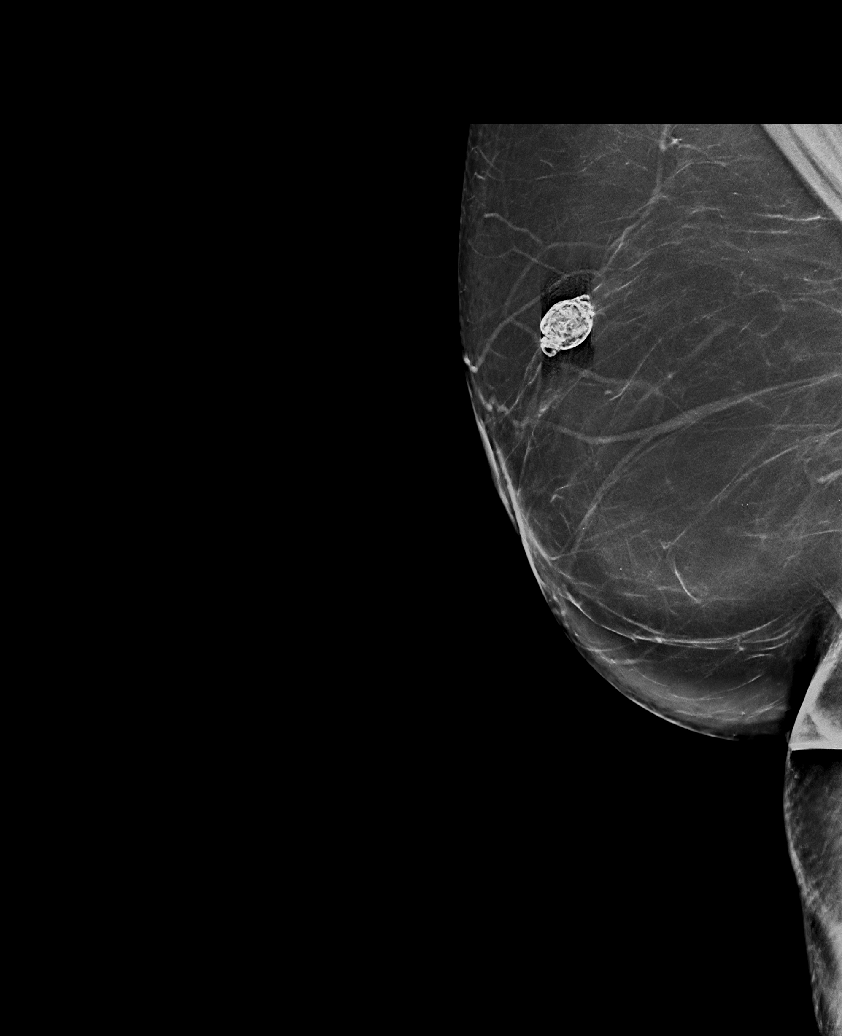

[R CC synth-2D]
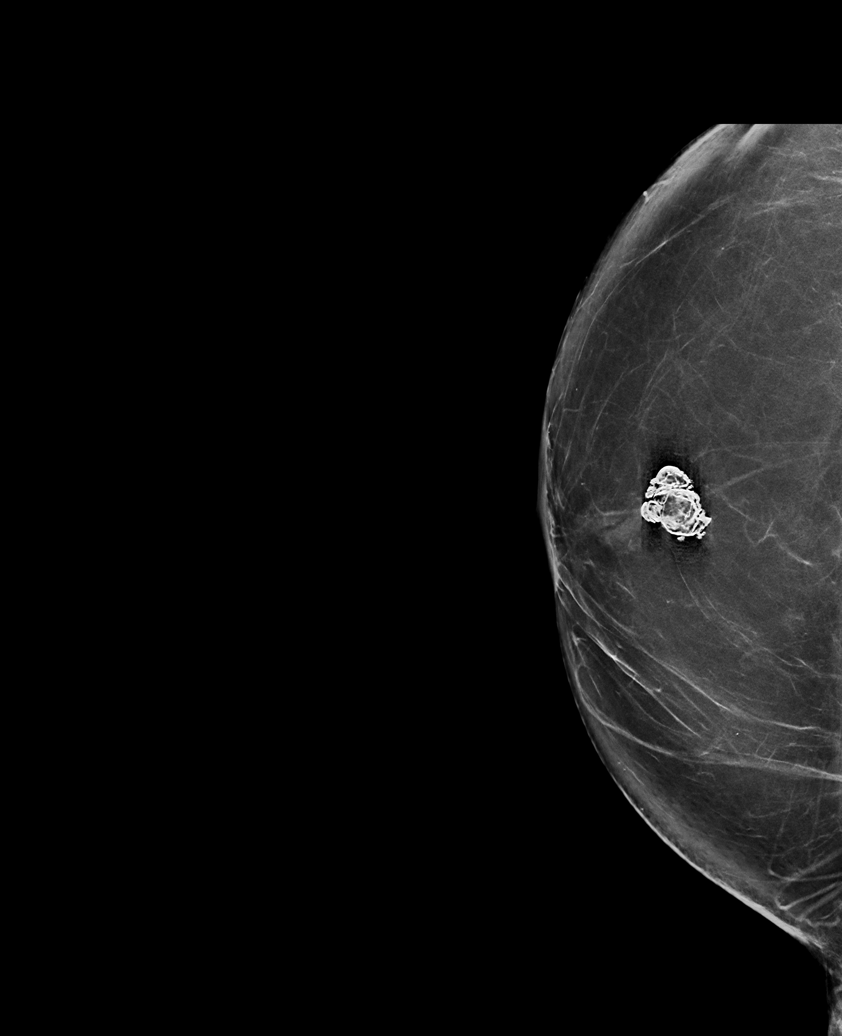

[L MLO synth-2D]
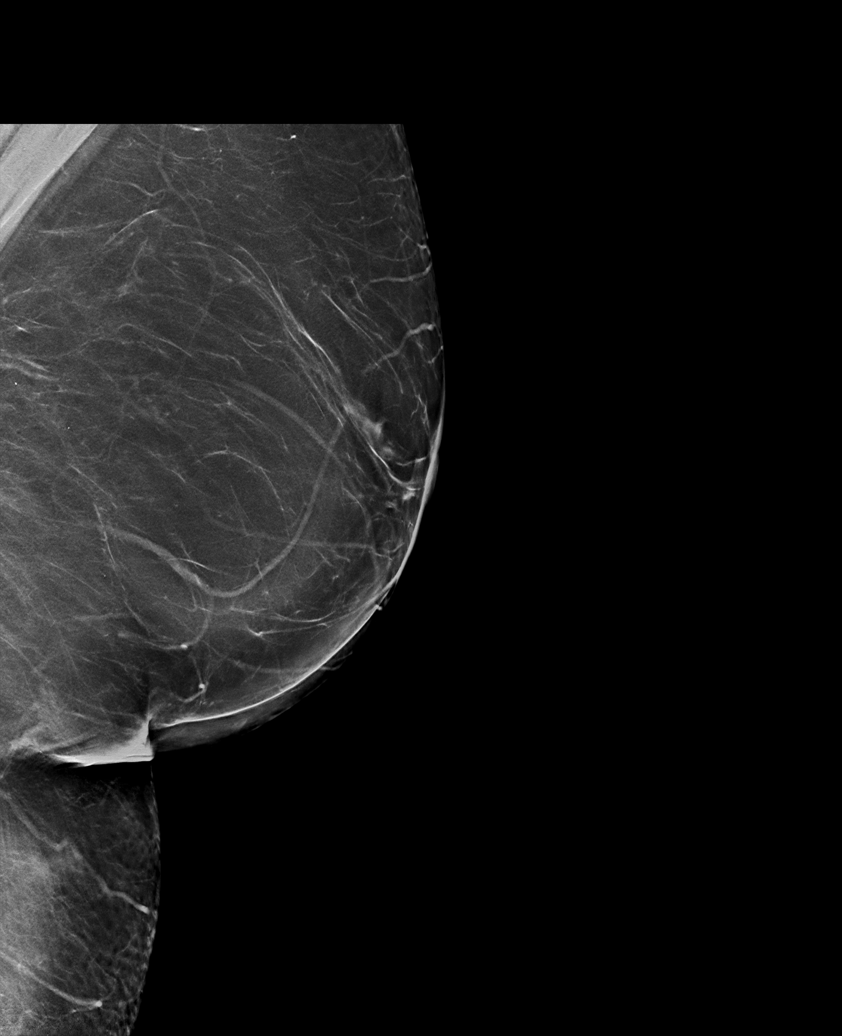

[L CC synth-2D]
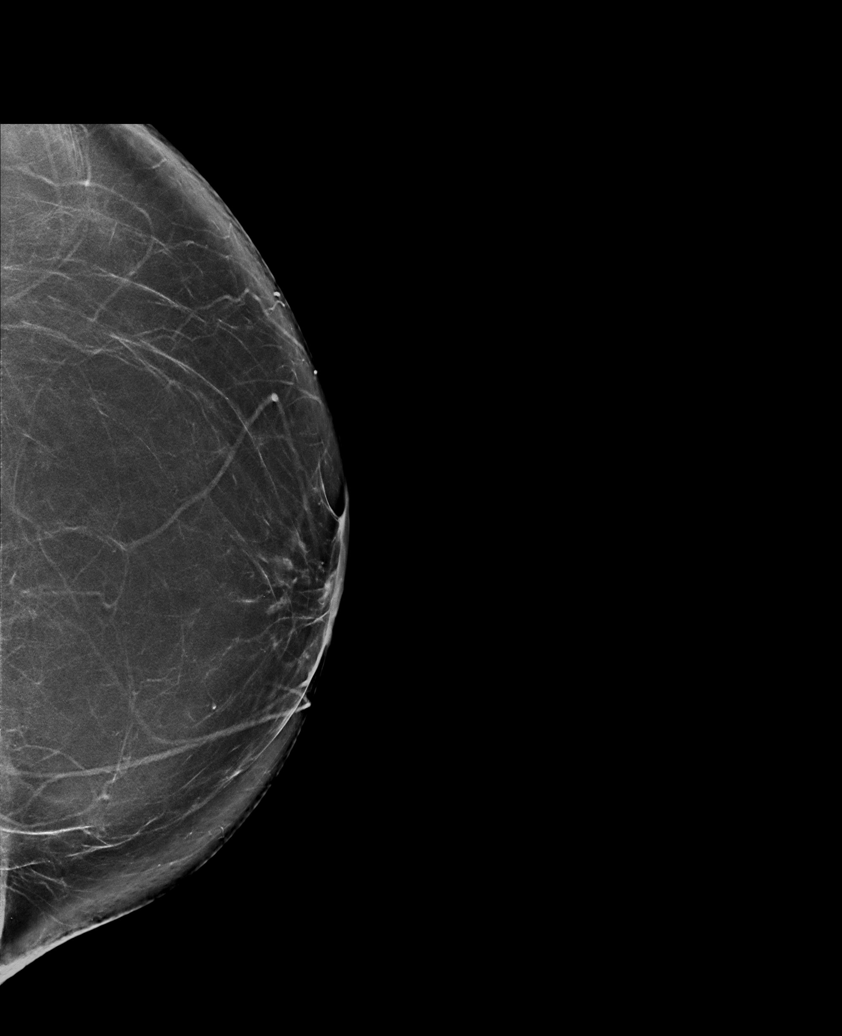

[R MLO synth-2D (2 of 2)]
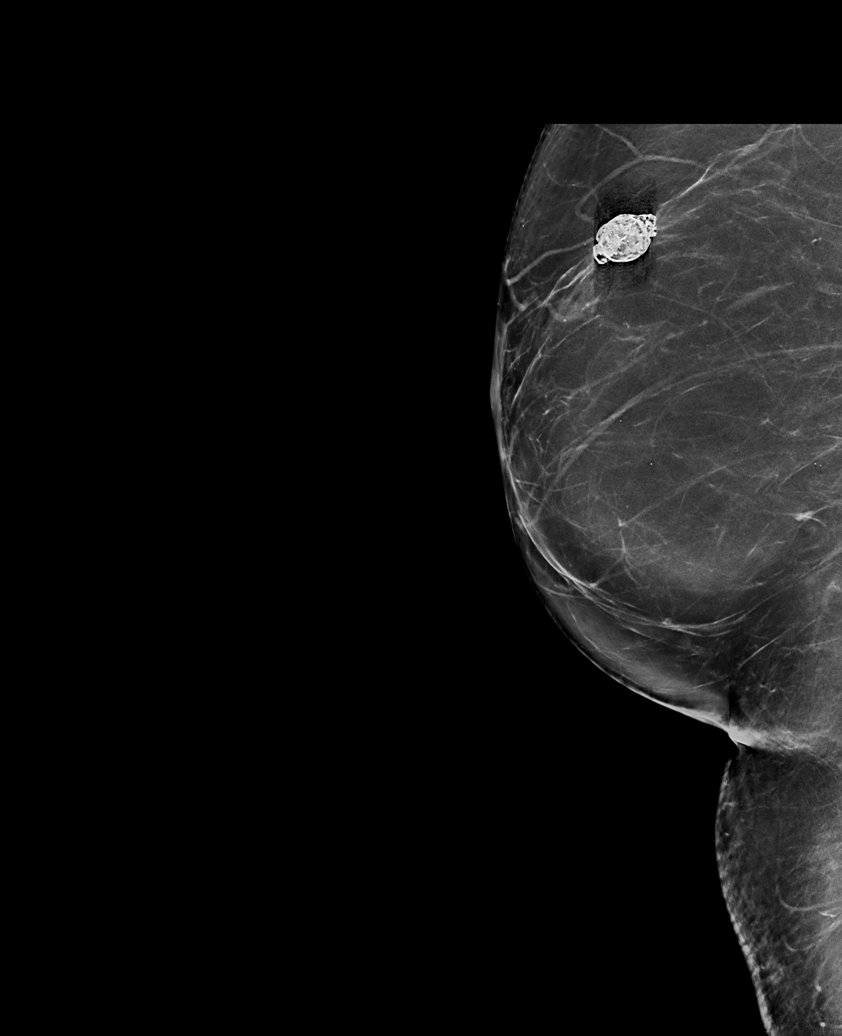

[R MLO tomo · tomo slice 43/85.0]
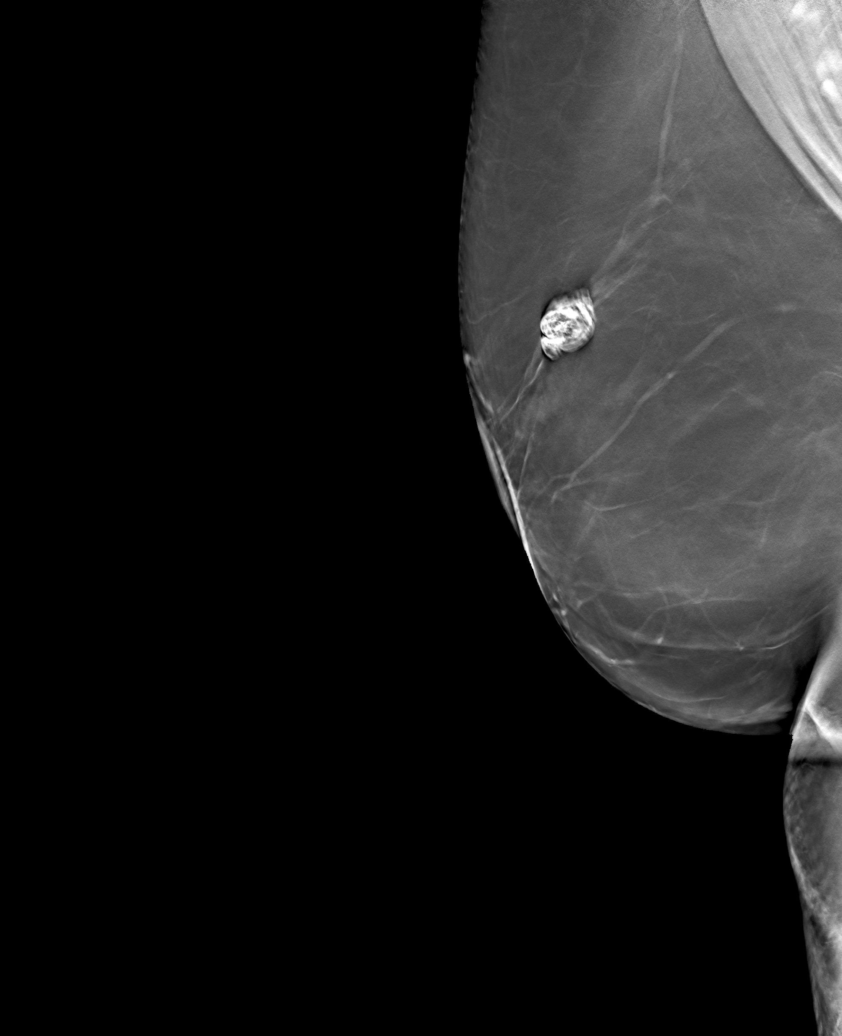

[6 of 30 positions shown; findings below may reference images not displayed]

FINDINGS: There are no findings suspicious for malignancy. Images were
processed with CAD.
IMPRESSION: No mammographic evidence of malignancy. A result letter of this
screening mammogram will be mailed directly to the patient.

RECOMMENDATION:
Screening mammogram in one year. (Code:[id number]-[id number])

BI-RADS CATEGORY  1: Negative.

## 2018-12-02 ENCOUNTER — Other Ambulatory Visit: Payer: Self-pay

## 2018-12-02 ENCOUNTER — Ambulatory Visit (HOSPITAL_BASED_OUTPATIENT_CLINIC_OR_DEPARTMENT_OTHER)
Admission: RE | Admit: 2018-12-02 | Discharge: 2018-12-02 | Disposition: A | Discharge status: Home / Self Care | Payer: Medicare Other | Source: Ambulatory Visit | Attending: Family Medicine | Admitting: Family Medicine

## 2018-12-02 DIAGNOSIS — Z1231 Encounter for screening mammogram for malignant neoplasm of breast: Secondary | ICD-10-CM

## 2018-12-14 ENCOUNTER — Telehealth (HOSPITAL_COMMUNITY): Payer: Self-pay

## 2018-12-14 NOTE — Telephone Encounter (Signed)
Spoke with patient to confirm echo appt. She stated she fell ill and will call back at another time to reschedule.

## 2018-12-15 ENCOUNTER — Other Ambulatory Visit (HOSPITAL_COMMUNITY): Payer: Medicare Other

## 2018-12-16 ENCOUNTER — Encounter (HOSPITAL_COMMUNITY): Payer: Medicare Other

## 2018-12-26 ENCOUNTER — Encounter: Payer: Self-pay | Admitting: Family Medicine

## 2018-12-28 MED ORDER — PENICILLIN V POTASSIUM 500 MG PO TABS: 500.0000 mg | ORAL_TABLET | Freq: Three times a day (TID) | ORAL | 0 refills | Status: AC

## 2018-12-28 NOTE — Addendum Note (Signed)
Addended by: Lamar Blinks C on: 12/28/2018 08:42 AM   Modules accepted: Orders

## 2019-01-15 NOTE — Progress Notes (Deleted)
HPI: FUmitral valve prolapse. Echocardiogram in 2012 showed normal LV function with no significant valvular heart disease. Carotid Dopplers 2012 showed minimal internal carotid artery disease bilaterally. Cath 3/18 showed 35 LAD, 45 Lcx, 60 ramus and hyperdynamic LV function.  Exercise treadmill and echocardiogram ordered at last office visit for chest pain, dyspnea and history of mitral valve prolapse.  Not yet performed.  Since last seen,  Current Outpatient Medications  Medication Sig Dispense Refill  . acetaminophen (TYLENOL) 325 MG tablet Take 325 mg by mouth every 6 (six) hours as needed for mild pain.     Marland Kitchen aspirin 81 MG tablet Take 81 mg by mouth once a week.     Marland Kitchen atorvastatin (LIPITOR) 20 MG tablet Take 1 tablet (20 mg total) by mouth daily at 6 PM. 90 tablet 3  . Coenzyme Q10 (CO Q-10) 100 MG CAPS Take 100 mg by mouth every Monday, Wednesday, and Friday. GUMMIES     . docusate sodium (COLACE) 100 MG capsule Take 1 capsule (100 mg total) by mouth every 12 (twelve) hours. (Patient not taking: Reported on 10/19/2018) 60 capsule 0  . Estradiol 10 MCG TABS vaginal tablet Place 1 tablet vaginally 2x a week as needed to maintain vaginal comfort (Patient taking differently: Place 1 tablet vaginally once a week. as needed to maintain vaginal comfort) 24 tablet 3  . losartan (COZAAR) 100 MG tablet Take 1 tablet (100 mg total) by mouth daily. 90 tablet 3  . losartan (COZAAR) 50 MG tablet TAKE ONE AND ONE-HALF (1 & 1/2) TABLET BY MOUTH DAILY 180 tablet 4  . metoprolol tartrate (LOPRESSOR) 50 MG tablet Take 1.5 tablets (75 mg total) by mouth 2 (two) times daily. (Patient taking differently: Take 50 mg by mouth 2 (two) times daily. ) 270 tablet 3  . nitroGLYCERIN (NITROSTAT) 0.4 MG SL tablet Place 1 tablet (0.4 mg total) under the tongue every 5 (five) minutes x 3 doses as needed for chest pain. 25 tablet 3  . Omega-3 Fatty Acids (FISH OIL) 1000 MG CAPS Take 1 capsule (1,000 mg total) by mouth 2  (two) times daily. (Patient taking differently: Take 1 capsule by mouth 2 (two) times daily. BURP-FREE GUMMIES) 180 capsule 3  . pantoprazole (PROTONIX) 40 MG tablet TAKE ONE TABLET BY MOUTH DAILY (Patient taking differently: Take 40 mg by mouth daily. ) 30 tablet 5  . penicillin v potassium (VEETID) 500 MG tablet Take 1 tablet (500 mg total) by mouth 3 (three) times daily. 30 tablet 0  . sucralfate (CARAFATE) 1 g tablet TAKE ONE TABLET BY MOUTH FOUR TIMES A DAY WITH MEALS AND AT BEDTIME (Patient taking differently: Take 1 g by mouth 4 (four) times daily -  with meals and at bedtime. ) 120 tablet 3   No current facility-administered medications for this visit.      Past Medical History:  Diagnosis Date  . Arthritis   . Cancer ([place])   . Diverticulosis   . Fibrocystic breast   . GERD (gastroesophageal reflux disease)   . Hiatal hernia   . History of chicken pox   . Hyperlipidemia   . Hypertension   . Mononucleosis    2/15  . MVP (mitral valve prolapse)   . Osteopenia 04/30/2015    Past Surgical History:  Procedure Laterality Date  . BREAST SURGERY Bilateral 2010   breast reduction  . CORONARY ANGIOGRAPHY  09/02/2016  . CYST EXCISION  06/2016   hyoid bone area per patient  .  HIATAL HERNIA REPAIR  2015   "severe hiatal hernia repair", robotic repair with pig skin sheath  . LEFT HEART CATH AND CORONARY ANGIOGRAPHY N/A 09/02/2016   Procedure: Left Heart Cath and Coronary Angiography;  Surgeon: Leonie Man, MD;  Location: [facility];  Service: Cardiovascular;  Laterality: N/A;  . REDUCTION MAMMAPLASTY Bilateral    2011  . TONSILLECTOMY AND ADENOIDECTOMY  1952    Social History   Socioeconomic History  . Marital status: Single    Spouse name: Not on file  . Number of children: 4  . Years of education: Not on file  . Highest education level: Not on file  Occupational History  . Occupation: Pharmacist, hospital  Social Needs  . Financial resource strain: Not on file  . Food  insecurity    Worry: Not on file    Inability: Not on file  . Transportation needs    Medical: Not on file    Non-medical: Not on file  Tobacco Use  . Smoking status: Never Smoker  . Smokeless tobacco: Never Used  Substance and Sexual Activity  . Alcohol use: No    Alcohol/week: 0.0 standard drinks    Comment: Rarely  . Drug use: No  . Sexual activity: Never  Lifestyle  . Physical activity    Days per week: Not on file    Minutes per session: Not on file  . Stress: Not on file  Relationships  . Social Herbalist on phone: Not on file    Gets together: Not on file    Attends religious service: Not on file    Active member of club or organization: Not on file    Attends meetings of clubs or organizations: Not on file    Relationship status: Not on file  . Intimate partner violence    Fear of current or ex partner: Not on file    Emotionally abused: Not on file    Physically abused: Not on file    Forced sexual activity: Not on file  Other Topics Concern  . Not on file  Social History Narrative   Recently moved to be near her daughter Mosetta Putt.     Has 4 children   Teaches latin Youth worker) Technology, STEM (middle school) Writer at [employer]   Completed masters degree   Divorced     Family History  Problem Relation Age of Onset  . Hypertension Mother   . Arthritis Mother   . Colon polyps Mother        had 2 surgeries  . Prostate cancer Father   . Arthritis Maternal Grandmother   . Diabetes Cousin        maternal  . Arthritis Sister   . Hypertension Sister   . Ovarian cancer Sister        ovarian and breast  . Hypertension Sister   . Breast cancer Sister     ROS: no fevers or chills, productive cough, hemoptysis, dysphasia, odynophagia, melena, hematochezia, dysuria, hematuria, rash, seizure activity, orthopnea, PND, pedal edema, claudication. Remaining systems are negative.  Physical Exam: Well-developed  well-nourished in no acute distress.  Skin is warm and dry.  HEENT is normal.  Neck is supple.  Chest is clear to auscultation with normal expansion.  Cardiovascular exam is regular rate and rhythm.  Abdominal exam nontender or distended. No masses palpated. Extremities show no edema. neuro grossly intact  ECG- personally reviewed  A/P  1 coronary artery disease-continue  aspirin and statin.  2 chest pain/dyspnea-we previously felt her symptoms may have been related to reflux.  We will reschedule echocardiogram to assess LV function and exercise treadmill.  3 hypertension-patient's blood pressure is controlled.  Continue present medications and follow.  4 hyperlipidemia-continue statin.  5 question history of mitral valve prolapse-as outlined above we will reschedule echocardiogram.  6 palpitations-plan to continue present dose of beta-blocker.  Kirk Ruths, MD

## 2019-01-20 ENCOUNTER — Ambulatory Visit: Payer: Medicare Other | Admitting: Cardiology

## 2019-03-08 ENCOUNTER — Ambulatory Visit (INDEPENDENT_AMBULATORY_CARE_PROVIDER_SITE_OTHER): Payer: Medicare Other | Admitting: Family

## 2019-03-08 ENCOUNTER — Other Ambulatory Visit: Payer: Self-pay

## 2019-03-08 DIAGNOSIS — N3000 Acute cystitis without hematuria: Secondary | ICD-10-CM | POA: Diagnosis not present

## 2019-03-08 MED ORDER — NITROFURANTOIN MONOHYD MACRO 100 MG PO CAPS: 100.0000 mg | ORAL_CAPSULE | Freq: Two times a day (BID) | ORAL | 0 refills | Status: DC

## 2019-03-08 NOTE — Progress Notes (Signed)
Virtual Visit via Telephone Note  I connected with Debra Burgess on [id number] at 12:20 PM EDT by telephone and verified that I am speaking with the correct person using two identifiers.  Location: Patient: home Provider: home   I discussed the limitations, risks, security and privacy concerns of performing an evaluation and management service by telephone and the availability of in person appointments. I also discussed with the patient that there may be a patient responsible charge related to this service. The patient expressed understanding and agreed to proceed.   History of Present Illness:  Patient is a 73 yr old female who presents today with concern about possible UTI.  Patient reports that symptoms began 4 days ago with nocturia. She reports that she has associated pain across her bilateral  lower back which is typical for her when she develops UTI. She reports that she also developed chills (this started yesterday afternoon).   She has not taken her temp.  Denies associated dysuria. Notes urine is more "yellowish than normal."  Also notes an associated abnormal odor.   Past Medical History:  Diagnosis Date  . Arthritis   . Cancer ([place])   . Diverticulosis   . Fibrocystic breast   . GERD (gastroesophageal reflux disease)   . Hiatal hernia   . History of chicken pox   . Hyperlipidemia   . Hypertension   . Mononucleosis    2/15  . MVP (mitral valve prolapse)   . Osteopenia 04/30/2015     Social History   Socioeconomic History  . Marital status: Single    Spouse name: Not on file  . Number of children: 4  . Years of education: Not on file  . Highest education level: Not on file  Occupational History  . Occupation: Pharmacist, hospital  Social Needs  . Financial resource strain: Not on file  . Food insecurity    Worry: Not on file    Inability: Not on file  . Transportation needs    Medical: Not on file    Non-medical: Not on file  Tobacco Use  . Smoking status: Never Smoker   . Smokeless tobacco: Never Used  Substance and Sexual Activity  . Alcohol use: No    Alcohol/week: 0.0 standard drinks    Comment: Rarely  . Drug use: No  . Sexual activity: Never  Lifestyle  . Physical activity    Days per week: Not on file    Minutes per session: Not on file  . Stress: Not on file  Relationships  . Social Herbalist on phone: Not on file    Gets together: Not on file    Attends religious service: Not on file    Active member of club or organization: Not on file    Attends meetings of clubs or organizations: Not on file    Relationship status: Not on file  . Intimate partner violence    Fear of current or ex partner: Not on file    Emotionally abused: Not on file    Physically abused: Not on file    Forced sexual activity: Not on file  Other Topics Concern  . Not on file  Social History Narrative   Recently moved to be near her daughter Mosetta Putt.     Has 4 children   Teaches latin Youth worker) Technology, STEM (middle school) Writer at [employer]   Completed masters degree   Divorced     Past Surgical History:  Procedure  Laterality Date  . BREAST SURGERY Bilateral 2010   breast reduction  . CORONARY ANGIOGRAPHY  09/02/2016  . CYST EXCISION  06/2016   hyoid bone area per patient  . HIATAL HERNIA REPAIR  2015   "severe hiatal hernia repair", robotic repair with pig skin sheath  . LEFT HEART CATH AND CORONARY ANGIOGRAPHY N/A 09/02/2016   Procedure: Left Heart Cath and Coronary Angiography;  Surgeon: Leonie Man, MD;  Location: [facility];  Service: Cardiovascular;  Laterality: N/A;  . REDUCTION MAMMAPLASTY Bilateral    2011  . TONSILLECTOMY AND ADENOIDECTOMY  1952    Family History  Problem Relation Age of Onset  . Hypertension Mother   . Arthritis Mother   . Colon polyps Mother        had 2 surgeries  . Prostate cancer Father   . Arthritis Maternal Grandmother   . Diabetes Cousin         maternal  . Arthritis Sister   . Hypertension Sister   . Ovarian cancer Sister        ovarian and breast  . Hypertension Sister   . Breast cancer Sister     Allergies  Allergen Reactions  . Mango Flavor Anaphylaxis    Had reaction to the fruit.  . Lipitor [Atorvastatin] Other (See Comments)    Caused neuropathy to one half of the patient's face    Current Outpatient Medications on File Prior to Visit  Medication Sig Dispense Refill  . acetaminophen (TYLENOL) 325 MG tablet Take 325 mg by mouth every 6 (six) hours as needed for mild pain.     Marland Kitchen aspirin 81 MG tablet Take 81 mg by mouth once a week.     Marland Kitchen atorvastatin (LIPITOR) 20 MG tablet Take 1 tablet (20 mg total) by mouth daily at 6 PM. 90 tablet 3  . Coenzyme Q10 (CO Q-10) 100 MG CAPS Take 100 mg by mouth every Monday, Wednesday, and Friday. GUMMIES     . docusate sodium (COLACE) 100 MG capsule Take 1 capsule (100 mg total) by mouth every 12 (twelve) hours. 60 capsule 0  . Estradiol 10 MCG TABS vaginal tablet Place 1 tablet vaginally 2x a week as needed to maintain vaginal comfort (Patient taking differently: Place 1 tablet vaginally once a week. as needed to maintain vaginal comfort) 24 tablet 3  . losartan (COZAAR) 100 MG tablet Take 1 tablet (100 mg total) by mouth daily. 90 tablet 3  . losartan (COZAAR) 50 MG tablet TAKE ONE AND ONE-HALF (1 & 1/2) TABLET BY MOUTH DAILY 180 tablet 4  . metoprolol tartrate (LOPRESSOR) 50 MG tablet Take 1.5 tablets (75 mg total) by mouth 2 (two) times daily. (Patient taking differently: Take 50 mg by mouth 2 (two) times daily. ) 270 tablet 3  . nitroGLYCERIN (NITROSTAT) 0.4 MG SL tablet Place 1 tablet (0.4 mg total) under the tongue every 5 (five) minutes x 3 doses as needed for chest pain. 25 tablet 3  . Omega-3 Fatty Acids (FISH OIL) 1000 MG CAPS Take 1 capsule (1,000 mg total) by mouth 2 (two) times daily. (Patient taking differently: Take 1 capsule by mouth 2 (two) times daily. BURP-FREE GUMMIES)  180 capsule 3  . pantoprazole (PROTONIX) 40 MG tablet TAKE ONE TABLET BY MOUTH DAILY (Patient taking differently: Take 40 mg by mouth daily. ) 30 tablet 5  . penicillin v potassium (VEETID) 500 MG tablet Take 1 tablet (500 mg total) by mouth 3 (three) times daily. [place]  tablet 0  . sucralfate (CARAFATE) 1 g tablet TAKE ONE TABLET BY MOUTH FOUR TIMES A DAY WITH MEALS AND AT BEDTIME (Patient taking differently: Take 1 g by mouth 4 (four) times daily -  with meals and at bedtime. ) 120 tablet 3   No current facility-administered medications on file prior to visit.     LMP 10/15/1997    Observations/Objective:   Gen: Awake, alert Resp: Breathing sounds even and non-labored Psych: calm/pleasant demeanor Neuro: Alert and Oriented x 3, speech sounds clear.  Assessment and Plan:  UTI- symptoms most consistent with UTI. We discussed having her bring by a urine specimen for culture but she prefers not to unless it is absolutely necessary due to COVID-19 risks.  She has responded well to macrobid previously. Will rx macrobid. Pt is advise to call if symptoms worsen or if not improved in 3 days. Would definitely need urine culture at that time and pt is agreeable to this plan.   Follow Up Instructions:    I discussed the assessment and treatment plan with the patient. The patient was provided an opportunity to ask questions and all were answered. The patient agreed with the plan and demonstrated an understanding of the instructions.   The patient was advised to call back or seek an in-person evaluation if the symptoms worsen or if the condition fails to improve as anticipated.  I provided 10 minutes of non-face-to-face time during this encounter.   Nance Pear, NP

## 2019-03-10 ENCOUNTER — Other Ambulatory Visit: Payer: Self-pay

## 2019-03-10 ENCOUNTER — Encounter: Payer: Self-pay | Admitting: Internal Medicine

## 2019-03-10 ENCOUNTER — Ambulatory Visit (INDEPENDENT_AMBULATORY_CARE_PROVIDER_SITE_OTHER): Payer: Medicare Other | Admitting: Internal Medicine

## 2019-03-10 ENCOUNTER — Encounter: Payer: Self-pay | Admitting: Family

## 2019-03-10 ENCOUNTER — Encounter: Payer: Self-pay | Admitting: Family Medicine

## 2019-03-10 VITALS — BP 168/82 | HR 73 | Temp 97.4°F | Resp 16 | Ht 65.5 in | Wt 196.0 lb

## 2019-03-10 DIAGNOSIS — I208 Other forms of angina pectoris: Secondary | ICD-10-CM | POA: Diagnosis not present

## 2019-03-10 DIAGNOSIS — N3000 Acute cystitis without hematuria: Secondary | ICD-10-CM | POA: Diagnosis not present

## 2019-03-10 DIAGNOSIS — I10 Essential (primary) hypertension: Secondary | ICD-10-CM | POA: Diagnosis not present

## 2019-03-10 LAB — POC URINALSYSI DIPSTICK (AUTOMATED)
Bilirubin, UA: NEGATIVE
Blood, UA: NEGATIVE
Glucose, UA: NEGATIVE
Ketones, UA: NEGATIVE
Leukocytes, UA: NEGATIVE
Nitrite, UA: NEGATIVE
Protein, UA: NEGATIVE
Spec Grav, UA: 1.02 (ref 1.010–1.025)
Urobilinogen, UA: 0.2 E.U./dL
pH, UA: 6 (ref 5.0–8.0)

## 2019-03-10 MED ORDER — SULFAMETHOXAZOLE-TRIMETHOPRIM 800-160 MG PO TABS: 1.0000 | ORAL_TABLET | Freq: Two times a day (BID) | ORAL | 0 refills | Status: AC

## 2019-03-10 NOTE — Telephone Encounter (Signed)
Got her on with paz today.

## 2019-03-10 NOTE — Progress Notes (Signed)
Subjective:    Patient ID: Debra Burgess, female    DOB: [birth year], 73 y.o.   MRN: [id number]  DOS:  03/10/2019 Type of visit - description: Acute  For several days, she was having chills, also backache located at the right flank with some radiation anteriorly, increased urinary frequency from baseline and  her urine look darker than usual. She felt all of the above were due to a UTI.  Was evaluated virtually 03/08/2019, started on antibiotics empirically, she is here because she is no better but no worse.   Chills have stopped She continue with urinary frequency, nocturia x4 last night. Has developed some nausea since she started the antibiotic.  BP Readings from Last 3 Encounters:  03/10/19 (!) 168/82  10/19/18 (!) 158/92  10/11/18 111/84      Review of Systems Denies fevers No vomiting, diarrhea or blood in the stools No vaginal discharge or bleeding No dysuria, gross hematuria or difficulty urinating.  Past Medical History:  Diagnosis Date  . Arthritis   . Cancer ([place])   . Diverticulosis   . Fibrocystic breast   . GERD (gastroesophageal reflux disease)   . Hiatal hernia   . History of chicken pox   . Hyperlipidemia   . Hypertension   . Mononucleosis    2/15  . MVP (mitral valve prolapse)   . Osteopenia 04/30/2015    Past Surgical History:  Procedure Laterality Date  . BREAST SURGERY Bilateral 2010   breast reduction  . CORONARY ANGIOGRAPHY  09/02/2016  . CYST EXCISION  06/2016   hyoid bone area per patient  . HIATAL HERNIA REPAIR  2015   "severe hiatal hernia repair", robotic repair with pig skin sheath  . LEFT HEART CATH AND CORONARY ANGIOGRAPHY N/A 09/02/2016   Procedure: Left Heart Cath and Coronary Angiography;  Surgeon: Leonie Man, MD;  Location: [facility];  Service: Cardiovascular;  Laterality: N/A;  . REDUCTION MAMMAPLASTY Bilateral    2011  . TONSILLECTOMY AND ADENOIDECTOMY  1952    Social History   Socioeconomic History  .  Marital status: Single    Spouse name: Not on file  . Number of children: 4  . Years of education: Not on file  . Highest education level: Not on file  Occupational History  . Occupation: Pharmacist, hospital  Social Needs  . Financial resource strain: Not on file  . Food insecurity    Worry: Not on file    Inability: Not on file  . Transportation needs    Medical: Not on file    Non-medical: Not on file  Tobacco Use  . Smoking status: Never Smoker  . Smokeless tobacco: Never Used  Substance and Sexual Activity  . Alcohol use: No    Alcohol/week: 0.0 standard drinks    Comment: Rarely  . Drug use: No  . Sexual activity: Never  Lifestyle  . Physical activity    Days per week: Not on file    Minutes per session: Not on file  . Stress: Not on file  Relationships  . Social Herbalist on phone: Not on file    Gets together: Not on file    Attends religious service: Not on file    Active member of club or organization: Not on file    Attends meetings of clubs or organizations: Not on file    Relationship status: Not on file  . Intimate partner violence    Fear of current or ex  partner: Not on file    Emotionally abused: Not on file    Physically abused: Not on file    Forced sexual activity: Not on file  Other Topics Concern  . Not on file  Social History Narrative   Recently moved to be near her daughter Mosetta Putt.     Has 4 children   Teaches latin Youth worker) Technology, STEM (middle school) Writer at [employer]   Completed masters degree   Divorced       Allergies as of 03/10/2019      Reactions   Mango Flavor Anaphylaxis   Had reaction to the fruit.   Lipitor [atorvastatin] Other (See Comments)   Caused neuropathy to one half of the patient's face      Medication List       Accurate as of March 10, 2019 11:59 PM. If you have any questions, ask your nurse or doctor.        STOP taking these medications   nitrofurantoin  (macrocrystal-monohydrate) 100 MG capsule Commonly known as: Macrobid Stopped by: Kathlene November, MD     TAKE these medications   acetaminophen 325 MG tablet Commonly known as: TYLENOL Take 325 mg by mouth every 6 (six) hours as needed for mild pain.   aspirin 81 MG tablet Take 81 mg by mouth once a week.   atorvastatin 20 MG tablet Commonly known as: LIPITOR Take 1 tablet (20 mg total) by mouth daily at 6 PM.   Co Q-10 100 MG Caps Take 100 mg by mouth every Monday, Wednesday, and Friday. GUMMIES   docusate sodium 100 MG capsule Commonly known as: COLACE Take 1 capsule (100 mg total) by mouth every 12 (twelve) hours.   Estradiol 10 MCG Tabs vaginal tablet Place 1 tablet vaginally 2x a week as needed to maintain vaginal comfort What changed:   how much to take  how to take this  when to take this  additional instructions   Fish Oil 1000 MG Caps Take 1 capsule (1,000 mg total) by mouth 2 (two) times daily. What changed: additional instructions   losartan 100 MG tablet Commonly known as: COZAAR Take 1 tablet (100 mg total) by mouth daily. What changed: Another medication with the same name was removed. Continue taking this medication, and follow the directions you see here. Changed by: Kathlene November, MD   metoprolol tartrate 50 MG tablet Commonly known as: LOPRESSOR Take 1.5 tablets (75 mg total) by mouth 2 (two) times daily. What changed: how much to take   nitroGLYCERIN 0.4 MG SL tablet Commonly known as: NITROSTAT Place 1 tablet (0.4 mg total) under the tongue every 5 (five) minutes x 3 doses as needed for chest pain.   pantoprazole 40 MG tablet Commonly known as: PROTONIX TAKE ONE TABLET BY MOUTH DAILY   penicillin v potassium 500 MG tablet Commonly known as: VEETID Take 1 tablet (500 mg total) by mouth 3 (three) times daily.   sucralfate 1 g tablet Commonly known as: CARAFATE TAKE ONE TABLET BY MOUTH FOUR TIMES A DAY WITH MEALS AND AT BEDTIME What changed: See  the new instructions.   sulfamethoxazole-trimethoprim 800-160 MG tablet Commonly known as: BACTRIM DS Take 1 tablet by mouth 2 (two) times daily. Started by: Kathlene November, MD           Objective:   Physical Exam Abdominal:      BP (!) 168/82 (BP Location: Left Arm, Patient Position: Sitting, Cuff Size: Normal)   Pulse  73   Temp (!) 97.4 F (36.3 C) (Temporal)   Resp 16   Ht 5' 5.5" (1.664 m)   Wt 196 lb (88.9 kg)   LMP 10/15/1997   SpO2 98%   BMI 32.12 kg/m  General:   Well developed, NAD, BMI noted.  HEENT:  Normocephalic . Face symmetric, atraumatic Abdomen:  Not distended, soft, slightly tender at the left side of the abdomen, see graphic.  No mass or rebound No CVA tenderness Skin: Not pale. Not jaundice Neurologic:  alert & oriented X3.  Speech normal, gait appropriate for age and unassisted Psych--  Cognition and judgment appear intact.  Cooperative with normal attention span and concentration.  Behavior appropriate. No anxious or depressed appearing.     Assessment     73 year old female, PMH includes HTN, high cholesterol, GERD, skin cancer, CAD, presents with  UTI? Symptoms as described above are somewhat nonspecific.  She reported pain at the right flank, on exam she is a slightly tender to palpation but  on the left. Per patient, her sxs are typically consistent with a UTI. Urinary dip today negative however she has taken antibiotics in the last 2 days. Plan: UA, urine culture We will treat empirically with Bactrim for possible urinary infection but she will need prompt reevaluation if she is not improving.  May need imaging, etc.  Patient verbalized understanding. HTN: BP is slightly elevated today, at home is typically 130/80, recommend to continue checking

## 2019-03-10 NOTE — Telephone Encounter (Signed)
I think that patient needs face to face, can we please get her in to see someone this afternoon?

## 2019-03-10 NOTE — Progress Notes (Signed)
Pre visit review using our clinic review tool, if applicable. No additional management support is needed unless otherwise documented below in the visit note. 

## 2019-03-10 NOTE — Patient Instructions (Signed)
Take the antibiotic as prescribed  Drink plenty of fluids  Call in few days if no better       Check the  blood pressure 2 or 3 times a  week  BP GOAL is between 110/65 and  135/85. If it is consistently higher or lower, let me know

## 2019-03-11 LAB — URINALYSIS, ROUTINE W REFLEX MICROSCOPIC
Bilirubin Urine: NEGATIVE
Hgb urine dipstick: NEGATIVE
Ketones, ur: NEGATIVE
Leukocytes,Ua: NEGATIVE
Nitrite: NEGATIVE
RBC / HPF: NONE SEEN (ref 0–?)
Specific Gravity, Urine: 1.02 (ref 1.000–1.030)
Total Protein, Urine: NEGATIVE
Urine Glucose: NEGATIVE
Urobilinogen, UA: 0.2 (ref 0.0–1.0)
pH: 5.5 (ref 5.0–8.0)

## 2019-03-12 LAB — URINE CULTURE
MICRO NUMBER:: 913812
SPECIMEN QUALITY:: ADEQUATE

## 2019-03-14 ENCOUNTER — Other Ambulatory Visit: Payer: Self-pay | Admitting: Family Medicine

## 2019-03-14 DIAGNOSIS — R1013 Epigastric pain: Secondary | ICD-10-CM

## 2019-07-01 DIAGNOSIS — E6609 Other obesity due to excess calories: Secondary | ICD-10-CM | POA: Diagnosis not present

## 2019-07-01 DIAGNOSIS — E782 Mixed hyperlipidemia: Secondary | ICD-10-CM | POA: Diagnosis not present

## 2019-07-01 DIAGNOSIS — Z6831 Body mass index (BMI) 31.0-31.9, adult: Secondary | ICD-10-CM | POA: Diagnosis not present

## 2019-07-01 DIAGNOSIS — N390 Urinary tract infection, site not specified: Secondary | ICD-10-CM | POA: Diagnosis not present

## 2019-07-01 DIAGNOSIS — I10 Essential (primary) hypertension: Secondary | ICD-10-CM | POA: Diagnosis not present

## 2019-07-01 DIAGNOSIS — J0100 Acute maxillary sinusitis, unspecified: Secondary | ICD-10-CM | POA: Diagnosis not present

## 2019-07-01 DIAGNOSIS — K219 Gastro-esophageal reflux disease without esophagitis: Secondary | ICD-10-CM | POA: Diagnosis not present

## 2019-07-08 DIAGNOSIS — R079 Chest pain, unspecified: Secondary | ICD-10-CM | POA: Diagnosis not present

## 2019-07-08 DIAGNOSIS — R911 Solitary pulmonary nodule: Secondary | ICD-10-CM | POA: Diagnosis not present

## 2019-07-08 DIAGNOSIS — K219 Gastro-esophageal reflux disease without esophagitis: Secondary | ICD-10-CM | POA: Diagnosis not present

## 2019-07-08 DIAGNOSIS — K449 Diaphragmatic hernia without obstruction or gangrene: Secondary | ICD-10-CM | POA: Diagnosis not present

## 2019-07-08 DIAGNOSIS — R0789 Other chest pain: Secondary | ICD-10-CM | POA: Diagnosis not present

## 2019-07-08 DIAGNOSIS — I10 Essential (primary) hypertension: Secondary | ICD-10-CM | POA: Diagnosis not present

## 2019-07-09 DIAGNOSIS — R079 Chest pain, unspecified: Secondary | ICD-10-CM | POA: Diagnosis not present

## 2019-07-09 DIAGNOSIS — R0789 Other chest pain: Secondary | ICD-10-CM | POA: Diagnosis not present

## 2019-07-09 DIAGNOSIS — R911 Solitary pulmonary nodule: Secondary | ICD-10-CM | POA: Diagnosis not present

## 2019-07-09 DIAGNOSIS — K449 Diaphragmatic hernia without obstruction or gangrene: Secondary | ICD-10-CM | POA: Diagnosis not present

## 2019-07-09 DIAGNOSIS — K219 Gastro-esophageal reflux disease without esophagitis: Secondary | ICD-10-CM | POA: Diagnosis not present

## 2019-07-12 DIAGNOSIS — K449 Diaphragmatic hernia without obstruction or gangrene: Secondary | ICD-10-CM | POA: Diagnosis not present

## 2019-07-12 DIAGNOSIS — K219 Gastro-esophageal reflux disease without esophagitis: Secondary | ICD-10-CM | POA: Diagnosis not present

## 2019-07-19 DIAGNOSIS — Z20822 Contact with and (suspected) exposure to covid-19: Secondary | ICD-10-CM | POA: Diagnosis not present

## 2019-07-19 DIAGNOSIS — Z01812 Encounter for preprocedural laboratory examination: Secondary | ICD-10-CM | POA: Diagnosis not present

## 2019-07-21 DIAGNOSIS — I10 Essential (primary) hypertension: Secondary | ICD-10-CM | POA: Diagnosis not present

## 2019-07-21 DIAGNOSIS — R933 Abnormal findings on diagnostic imaging of other parts of digestive tract: Secondary | ICD-10-CM | POA: Diagnosis not present

## 2019-07-21 DIAGNOSIS — K2100 Gastro-esophageal reflux disease with esophagitis, without bleeding: Secondary | ICD-10-CM | POA: Diagnosis not present

## 2019-07-21 DIAGNOSIS — K228 Other specified diseases of esophagus: Secondary | ICD-10-CM | POA: Diagnosis not present

## 2019-07-21 DIAGNOSIS — K219 Gastro-esophageal reflux disease without esophagitis: Secondary | ICD-10-CM | POA: Diagnosis not present

## 2019-07-21 DIAGNOSIS — K3189 Other diseases of stomach and duodenum: Secondary | ICD-10-CM | POA: Diagnosis not present

## 2019-07-21 DIAGNOSIS — K449 Diaphragmatic hernia without obstruction or gangrene: Secondary | ICD-10-CM | POA: Diagnosis not present

## 2019-07-21 DIAGNOSIS — K2970 Gastritis, unspecified, without bleeding: Secondary | ICD-10-CM | POA: Diagnosis not present

## 2019-08-10 DIAGNOSIS — K219 Gastro-esophageal reflux disease without esophagitis: Secondary | ICD-10-CM | POA: Diagnosis not present

## 2019-08-17 DIAGNOSIS — Z6831 Body mass index (BMI) 31.0-31.9, adult: Secondary | ICD-10-CM | POA: Diagnosis not present

## 2019-08-17 DIAGNOSIS — I340 Nonrheumatic mitral (valve) insufficiency: Secondary | ICD-10-CM | POA: Diagnosis not present

## 2019-08-17 DIAGNOSIS — R079 Chest pain, unspecified: Secondary | ICD-10-CM | POA: Diagnosis not present

## 2019-08-17 DIAGNOSIS — I10 Essential (primary) hypertension: Secondary | ICD-10-CM | POA: Diagnosis not present

## 2019-08-17 DIAGNOSIS — E782 Mixed hyperlipidemia: Secondary | ICD-10-CM | POA: Diagnosis not present
